# Patient Record
Sex: Male | Born: 1965 | Race: White | Hispanic: No | Smoking: Never smoker
Health system: Southern US, Community
[De-identification: ages and names within clinical notes are randomized; demographics above are authoritative.]

## PROBLEM LIST (undated history)

## (undated) DIAGNOSIS — I639 Cerebral infarction, unspecified: Secondary | ICD-10-CM

## (undated) DIAGNOSIS — F329 Major depressive disorder, single episode, unspecified: Secondary | ICD-10-CM

## (undated) DIAGNOSIS — F32A Depression, unspecified: Secondary | ICD-10-CM

## (undated) DIAGNOSIS — G4730 Sleep apnea, unspecified: Secondary | ICD-10-CM

## (undated) DIAGNOSIS — E119 Type 2 diabetes mellitus without complications: Secondary | ICD-10-CM

## (undated) DIAGNOSIS — G459 Transient cerebral ischemic attack, unspecified: Secondary | ICD-10-CM

## (undated) DIAGNOSIS — M5430 Sciatica, unspecified side: Secondary | ICD-10-CM

## (undated) DIAGNOSIS — I10 Essential (primary) hypertension: Secondary | ICD-10-CM

## (undated) HISTORY — PX: BACK SURGERY: SHX140

## (undated) HISTORY — DX: Cerebral infarction, unspecified: I63.9

## (undated) HISTORY — DX: Sciatica, unspecified side: M54.30

## (undated) HISTORY — PX: BILATERAL KNEE ARTHROSCOPY: SUR91

## (undated) HISTORY — PX: CHOLECYSTECTOMY: SHX55

---

## 2008-12-18 ENCOUNTER — Ambulatory Visit: Payer: Self-pay | Admitting: Specialist

## 2008-12-24 ENCOUNTER — Ambulatory Visit: Payer: Self-pay | Admitting: Specialist

## 2012-01-11 ENCOUNTER — Observation Stay: Payer: Self-pay | Admitting: Internal Medicine

## 2012-01-11 LAB — TROPONIN I
Troponin-I: <0.02 ng/mL
Troponin-I: <0.02 ng/mL

## 2012-01-11 LAB — CK TOTAL AND CKMB (NOT AT ARMC)
CK, Total: 58 U/L (ref 35–232)
CK-MB: 0.5 ng/mL (ref 0.5–3.6)
CK-MB: <0.5 ng/mL — ABNORMAL LOW (ref 0.5–3.6)

## 2012-01-11 LAB — URINALYSIS, COMPLETE
Bacteria: NONE SEEN
Blood: NEGATIVE
Glucose,UR: >=500 mg/dL (ref 0–75)
Leukocyte Esterase: NEGATIVE
Nitrite: NEGATIVE
Ph: 6 (ref 4.5–8.0)
Protein: NEGATIVE
RBC,UR: <1 /HPF (ref 0–5)
Specific Gravity: 1.039 (ref 1.003–1.030)
WBC UR: <1 /HPF (ref 0–5)

## 2012-01-11 LAB — MAGNESIUM: Magnesium: 1.7 mg/dL — ABNORMAL LOW

## 2012-01-11 LAB — COMPREHENSIVE METABOLIC PANEL
Anion Gap: 7 (ref 7–16)
BUN: 16 mg/dL (ref 7–18)
Bilirubin,Total: 1.9 mg/dL — ABNORMAL HIGH (ref 0.2–1.0)
Chloride: 97 mmol/L — ABNORMAL LOW (ref 98–107)
Co2: 28 mmol/L (ref 21–32)
EGFR (African American): >60
EGFR (Non-African Amer.): >60
Osmolality: 280 (ref 275–301)
Potassium: 3.8 mmol/L (ref 3.5–5.1)
SGOT(AST): 18 U/L (ref 15–37)
SGPT (ALT): 39 U/L (ref 12–78)
Total Protein: 7.8 g/dL (ref 6.4–8.2)

## 2012-01-11 LAB — CBC
HCT: 46.7 % (ref 40.0–52.0)
MCV: 90 fL (ref 80–100)
RBC: 5.18 10*6/uL (ref 4.40–5.90)

## 2012-01-11 LAB — LIPID PANEL: Triglycerides: 197 mg/dL (ref 0–200)

## 2014-08-26 NOTE — H&P (Signed)
PATIENT NAME:  Brian Daniel, Brian Daniel MR#:  [id number] DATE OF BIRTH:  [birth year]  DATE OF ADMISSION:  01/11/2012  PRIMARY CARE PHYSICIAN:  Dr. Dennison Mascot  ED REFERRING PHYSICIAN:    Dr. Gaetano Net    CHIEF COMPLAINT: Left-sided numbness and impaired speech.   HISTORY OF PRESENT ILLNESS: 49 year old Caucasian male with past medical history notable for prediabetes and hypertension presents with one day's duration of paresthesias and slurred speech. The patient was in his usual state of health until one day prior to admission when he developed nausea at work. It was subsequently noted that he was hypertensive with blood pressure of 150/100, which is unusually high for him. He also notes that he had some slurred speech and his wife noticed this as well. However, he just stayed home and waited for these symptoms to pass. There was some associated dizziness as well.  He denies any headaches. At about 3:00 a.m. on the day of presentation the patient woke up from sleep to micturate and at that time noticed that he had left-sided numbness and tingling, felt like "my arm was asleep". The sensation was intermittent. It would last for seconds and would recur within a few minutes and due to recurrence the patient decided to present in the ER.  Please note that the paresthesias developed first in his arm and then proceeded to his legs. At the time of my examination he notes that the arm tingling has resolved, but he still has intermittent and transient left lower extremity paresthesias. He notes that this morning when he noticed his left arm numbness and tingling he also had mild left retrosternal chest pain that felt like a tightness and soreness that lasted about five minutes and faded away with rest.  " I felt my heart beating."   On evaluation in the Emergency Department the patient was noted to have elevated blood glucose of 353 and was normotensive with systolic blood pressure 133/93.   PAST MEDICAL HISTORY:   1. Borderline diabetes.  2. Hypertension.   3. Possible benign prostatic hypertrophy. He notes he had symptoms of hesitancy. He was placed on Flomax and his symptoms have improved.  4. Denies history of hyperlipidemia.  5. History of tendinitis in the right elbow.  6. Internal hemorrhoids.  PAST SURGICAL HISTORY:  1. Low back surgery.  2. Bilateral knee arthroscopies.  3. Right elbow arthroscopy due to tendinitis.  4. Cholecystectomy.   ALLERGIES: No known drug allergies.   MEDICATIONS:  1. Hydrochlorothiazide/lisinopril 12.5 mg/20 mg daily.  2. Flomax 0.4 mg daily.  3. Aspirin 325 mg daily.   FAMILY HISTORY: Both parents are healthy. The patient denies any medical problems. Father had epilepsy; however, he said this is due to trauma and was not hereditary.  He has one sister that is healthy. Maternal grandfather had coronary artery disease, paternal grandfather also had coronary artery disease. His maternal grandmother expired from complications of surgery.   SOCIAL HISTORY: He works as a Retail banker for [employer]. He has never smoked. He drinks two glasses of alcohol monthly. Denies illicit drug use. He has been married for three months.   REVIEW OF SYSTEMS: CONSTITUTIONAL: No fever. Admits to fatigue. No weight loss. No weight gain.  EYES: Denies blurred or double vision. No pain. ENT: Intermittent tinnitus. He has been told he snores. RESPIRATORY: Denies cough or wheeze. Admits to some shortness of breath, feeling like he just had to catch his breath over the last 24 hours. CARDIOVASCULAR: Positive chest pain  as noted in the history of present illness. No orthopnea. No edema. Mild dyspnea on exertion. He reports that after one flight of steps he feels worn out. This has been going on over the last 24 hours as well. GI: Admitted to nausea. Denies vomiting, diarrhea.  He had rectal bleeding a few weeks ago deemed internal hemorrhoids. Denies melena. GU: Denies  dysuria or frequency. Admits to polyuria, but this is chronic since he has been on his current medications. Denies polydipsia. ENDOCRINE: Denies polyuria. Admits to nocturia average 2 times per night and this is chronic. Denies increased sweating. Admits to cold sweats during his episode a day prior to admission. INTEGUMENT: Denies new rash or acne. MUSCULOSKELETAL: Denies pain in joints or swelling. Admits to intermittent cramps.  NEURO: Admits to numbness as mentioned  in the history of present illness as well as dysarthria. Denies weakness or headaches. Admits to dizziness with position changes, particularly when he goes from sitting to standing.  PSYCH: Denies anxiety, insomnia, or depression.   PHYSICAL EXAMINATION:  VITAL SIGNS: Temperature 97.9, heart rate 77, respirations 18, blood pressure 133/93, sating 97% on room air.  He weighs 210 pounds (95.2 kg).   GENERAL: Well-appearing Caucasian male lying comfortably on a stretcher.   EYES: Anicteric. Pink conjunctivae. Pupils equally round and reactive to light and accommodation.   ENT: Normal external ears and nares. Posterior oropharyngeal crowding is noted. There are no oral lesions. Moist mucous membranes.   NECK: Supple. There is no thyromegaly.   CARDIOVASCULAR: Normal S1, S2, regular rate and rhythm. No murmurs, rubs, or gallops. No pretibial edema. Normal distal pulses.   PULMONARY: Clear to auscultation bilaterally. No wheezes, rales, or rhonchi. Normal effort.   ABDOMEN: Soft, nontender, nondistended with normal bowel sounds. No hepatosplenomegaly is appreciated.   SKIN: Warm and dry. There are freckles as well as small benign nevi on his trunk. No areas of erythema or induration are appreciated. There is normal color.   PSYCHIATRIC: Awake, alert, oriented times three with normal insight and judgment.   LABORATORY DATA: Beta natriuretic peptide is less than 5, CK total is 73, CK-MB 0.5, glucose 353 on the basic metabolic panel.  BUN 16, creatinine 1.3. Sodium 132, potassium is 3.8, chloride 97, bicarbonate 28, calcium 9.4, bilirubin is 1.9, alkaline phosphatase 77, ALT 39, AST 18, total protein 7.8, albumin 4.1, osmolality 280, anion gap 7, WBC count 6.3, hemoglobin 16.6, hematocrit 46.7, and platelet count of 174, MCV of 90, magnesium is 1.7, which is slightly low. Troponin is less than 0.02. CT head without contrast shows no evidence of mass effect, midline shift or extra-axial fluid collection. There is no evidence of space-occupying lesion or intracranial hemorrhage. There is a small wedge-shaped hypodensity in the left cerebellum concerning for an age-indeterminate infarct. Point of care Accu-Chek is 344.   EKG shows normal sinus rhythm at 80 beats per minute with poor R-wave progression with probable voltage criteria for left ventricular hypertrophy.  ASSESSMENT AND PLAN: 49 year old gentleman presenting with dysarthria, paresthesias, hypertension, and chest pain, found to be hyperglycemic, evidence of cerebellar infarct concerning for acute cerebrovascular accident.  1. Cerebrovascular accident/transient ischemic attack: Evidence of hypodensity in the left cerebellum. We will rule out acute cerebrovascular accident by checking MRI of the brain. We will check carotid ultrasounds. We will check an echocardiogram. We will risk stratify the patient given his hyperglycemia. We will check an A1c to rule out frank diabetes. We will control his blood pressure. We will start him  on a statin empirically and aspirin daily and check a fasting lipid panel in the morning.  Speech evaluation has been performed in the Emergency Department and the patient passed. Will check TSH. 2. Chest pain: This might have been as a result of hyperglycemia. However, given associated shortness of breath we will rule out cardiac disease by trending cardiac enzymes and would recommend possible outpatient stress test for further evaluation if cardiac enzymes are  negative. 3.  Hyperglycemia: Pre diabetes reportedly in the past. Chek hemoglobin A1C for futher evaluation. Normal saline boluses for 2 liters then will continue normal saline at 13925ml/hr. Start sliding scale insulin coverage and outpatient diabetic regimen will be determined when more data is available.  4.  Hypomagnesemia: Repleted 5. Hypertension: Well controlled at this point. We will maintain him on hydrochlorothiazide and lisinopril and have p.r.n. medications for elevated blood pressures.  6. Prostatic hypertrophy: Continue Flomax.  7. Prophylaxis: Lovenox. 8. CODE STATUS: FULL CODE.   DISPOSITION:  The patient is being admitted for further monitoring and evaluation. Anticipate discharge in about 48-72 hours.   TIME SPENT: 60 minutes.   ____________________________ Aurther LoftAdaorah E. Brenan [place], DO aeo:bjt D: 01/11/2012 08:34:14 ET T: 01/11/2012 09:29:17 ET JOB#: [id number]  cc: Aurther LoftAdaorah E. Iden Stripling, DO, <Dictator> Dennison MascotLemont Morrisey, MD Birney Belshe E Venkat Ankney DO ELECTRONICALLY SIGNED 01/12/2012 5:48

## 2014-08-26 NOTE — Discharge Summary (Signed)
PATIENT NAME:  Brian Daniel, Brian Daniel MR#:  [id number] DATE OF BIRTH:  Sep 21, 1965  DATE OF ADMISSION:  01/11/2012 DATE OF DISCHARGE:  01/11/2012  DISCHARGE DIAGNOSES:  1. 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  2. Hypomagnesemia, repleted.  3. Uncontrolled diabetes with hemoglobin A1c greater than 10, noncompliant with medications. Was counseled for a long time and given diabetic education. Recommended to follow-up with diabetic clinic as an outpatient and with endocrine physician. Started on glyburide.   SECONDARY DIAGNOSES:  1. Diabetes.  2. Hypertension.  3. Benign prostatic hypertrophy.  4. Hyperlipidemia.  5. History of internal hemorrhoids.   CONSULTATIONS: None.   PROCEDURES/RADIOLOGY:  1. CT scan of the head without contrast on September 4th showed small age indeterminate left cerebellar infarct.   2. MRI of the brain without contrast on September 4th showed findings consistent with chronic regional small peripheral lacunar infarction along the posterolateral aspect of the left cerebellar hemisphere. No evidence of acute abnormality.  3. MRA of the brain without contrast on September 4th showed unremarkable circle of Willis.  4. Carotid Doppler's on September 4th showed no evidence of hemodynamically significant stenosis.  5. 2-D echocardiogram on September 4th showed normal LV systolic function. EF of 55%. Mild mitral regurgitation. No apparent source of CVA.   HISTORY AND SHORT HOSPITAL COURSE: The patient is a 49 year old male with above-mentioned medical problems who was admitted for possible TIA versus CVA. He was found to have uncontrolled diabetes with hemoglobin A1c of 10.8. The patient did admit to not taking his diabetes medication in the form of metformin and was not checking his sugars at home  also. He had negative two sets of cardiac enzymes and had a normal TSH. He did have low magnesium which was replaced. He underwent complete neurological evaluation which showed no acute findings and he was discharged home in stable condition as his symptoms had resolved.   On the date of discharge, his vital signs were as follows: Temperature 98, heart rate 86 per minute, respirations 18 per minute, blood pressure 119/80 mmHg. He was saturating 94% on room air.   PERTINENT PHYSICAL EXAMINATION ON THE DATE OF DISCHARGE: CARDIOVASCULAR: S1, S2 normal. No murmur, rubs, or gallop. LUNGS: Clear to auscultation bilaterally. No wheezing, rales, rhonchi, or crepitation. ABDOMEN: Soft, benign. NEUROLOGIC: Nonfocal examination. All other physical examination remained at baseline.   DISCHARGE MEDICATIONS:  1. Hydrochlorothiazide/lisinopril 12.5/20 one tablet p.o. daily.   2. Flomax 0.4 mg p.o. daily.  3. Lipitor 10 mg p.o. at bedtime.  4. Glipizide 10 mg p.o. daily.   DISCHARGE DIET: Low sodium, low fat, low cholesterol, 1800 ADA.   DISCHARGE ACTIVITY: As tolerated.   DISCHARGE INSTRUCTIONS AND FOLLOW-UP:  1. The patient was instructed to follow-up with his primary care physician, Dr. Dennison MascotLemont Morrisey, in 1 to 2 weeks.  2. He will need follow-up with [facility] in 2 to 4 weeks.  3. He was also instructed to follow-up with [facility] in 1 to 2 weeks for diabetic education.          TOTAL TIME DISCHARGING THIS PATIENT: 55 minutes.   ____________________________ Ellamae SiaVipul S. Sherryll BurgerShah, MD vss:drc D: 01/11/2012 22:56:19 ET T: 01/13/2012 11:29:29 ET JOB#: [id number]  cc: Arman Loy S. Sherryll BurgerShah, MD, <Dictator> Dennison MascotLemont Morrisey, MD A. Wendall MolaMelissa Solum, MD [facility]  Ellamae Sia [facility] MD ELECTRONICALLY SIGNED 01/13/2012 23:23

## 2016-05-04 ENCOUNTER — Encounter: Payer: Self-pay | Admitting: Emergency Medicine

## 2016-05-04 ENCOUNTER — Observation Stay
Admission: EM | Admit: 2016-05-04 | Discharge: 2016-05-05 | Disposition: A | Discharge status: Home / Self Care | Payer: BLUE CROSS/BLUE SHIELD | Attending: Internal Medicine | Admitting: Internal Medicine

## 2016-05-04 ENCOUNTER — Emergency Department: Payer: BLUE CROSS/BLUE SHIELD

## 2016-05-04 DIAGNOSIS — E119 Type 2 diabetes mellitus without complications: Secondary | ICD-10-CM | POA: Diagnosis not present

## 2016-05-04 DIAGNOSIS — I10 Essential (primary) hypertension: Secondary | ICD-10-CM | POA: Diagnosis not present

## 2016-05-04 DIAGNOSIS — T402X5A Adverse effect of other opioids, initial encounter: Secondary | ICD-10-CM | POA: Insufficient documentation

## 2016-05-04 DIAGNOSIS — R0789 Other chest pain: Secondary | ICD-10-CM | POA: Diagnosis present

## 2016-05-04 DIAGNOSIS — Z79899 Other long term (current) drug therapy: Secondary | ICD-10-CM | POA: Diagnosis not present

## 2016-05-04 DIAGNOSIS — T450X5A Adverse effect of antiallergic and antiemetic drugs, initial encounter: Secondary | ICD-10-CM | POA: Diagnosis not present

## 2016-05-04 DIAGNOSIS — R55 Syncope and collapse: Secondary | ICD-10-CM

## 2016-05-04 DIAGNOSIS — Z8673 Personal history of transient ischemic attack (TIA), and cerebral infarction without residual deficits: Secondary | ICD-10-CM | POA: Diagnosis not present

## 2016-05-04 DIAGNOSIS — R001 Bradycardia, unspecified: Secondary | ICD-10-CM | POA: Diagnosis not present

## 2016-05-04 DIAGNOSIS — R079 Chest pain, unspecified: Secondary | ICD-10-CM | POA: Diagnosis not present

## 2016-05-04 DIAGNOSIS — Z7984 Long term (current) use of oral hypoglycemic drugs: Secondary | ICD-10-CM | POA: Diagnosis not present

## 2016-05-04 HISTORY — DX: Transient cerebral ischemic attack, unspecified: G45.9

## 2016-05-04 HISTORY — DX: Type 2 diabetes mellitus without complications: E11.9

## 2016-05-04 HISTORY — DX: Essential (primary) hypertension: I10

## 2016-05-04 LAB — TROPONIN I

## 2016-05-04 LAB — BASIC METABOLIC PANEL
Anion gap: 9 (ref 5–15)
BUN: 12 mg/dL (ref 6–20)
CALCIUM: 9.3 mg/dL (ref 8.9–10.3)
CO2: 24 mmol/L (ref 22–32)
CREATININE: 1.04 mg/dL (ref 0.61–1.24)
Chloride: 100 mmol/L — ABNORMAL LOW (ref 101–111)
GFR calc non Af Amer: >60 mL/min (ref >60)
Glucose, Bld: 350 mg/dL — ABNORMAL HIGH (ref 65–99)
Potassium: 3.1 mmol/L — ABNORMAL LOW (ref 3.5–5.1)
SODIUM: 133 mmol/L — AB (ref 135–145)

## 2016-05-04 LAB — CBC
HCT: 46.1 % (ref 40.0–52.0)
Hemoglobin: 16 g/dL (ref 13.0–18.0)
MCH: 30.5 pg (ref 26.0–34.0)
MCHC: 34.8 g/dL (ref 32.0–36.0)
MCV: 87.6 fL (ref 80.0–100.0)
PLATELETS: 196 10*3/uL (ref 150–440)
RBC: 5.26 MIL/uL (ref 4.40–5.90)
RDW: 12.8 % (ref 11.5–14.5)
WBC: 6.6 10*3/uL (ref 3.8–10.6)

## 2016-05-04 NOTE — ED Triage Notes (Signed)
Patient states that he developed chest pain this afternoon that has become worse throughout the day. Patient states that it is hard to take a deep breath.

## 2016-05-05 ENCOUNTER — Emergency Department: Payer: BLUE CROSS/BLUE SHIELD

## 2016-05-05 ENCOUNTER — Encounter: Payer: Self-pay | Admitting: Radiology

## 2016-05-05 DIAGNOSIS — R079 Chest pain, unspecified: Secondary | ICD-10-CM | POA: Diagnosis present

## 2016-05-05 DIAGNOSIS — R0789 Other chest pain: Secondary | ICD-10-CM | POA: Diagnosis present

## 2016-05-05 LAB — GLUCOSE, CAPILLARY
GLUCOSE-CAPILLARY: 284 mg/dL — AB (ref 65–99)
Glucose-Capillary: 256 mg/dL — ABNORMAL HIGH (ref 65–99)
Glucose-Capillary: 278 mg/dL — ABNORMAL HIGH (ref 65–99)

## 2016-05-05 LAB — FIBRIN DERIVATIVES D-DIMER (ARMC ONLY): FIBRIN DERIVATIVES D-DIMER (ARMC): 203 (ref 0–499)

## 2016-05-05 LAB — MAGNESIUM: Magnesium: 2 mg/dL (ref 1.7–2.4)

## 2016-05-05 LAB — TROPONIN I: Troponin I: <0.03 ng/mL (ref <0.03)

## 2016-05-05 LAB — TSH: TSH: 2.596 u[IU]/mL (ref 0.350–4.500)

## 2016-05-05 MED ORDER — DOCUSATE SODIUM 100 MG PO CAPS: 100.0000 mg | ORAL_CAPSULE | Freq: Two times a day (BID) | ORAL | Status: DC

## 2016-05-05 MED ORDER — ONDANSETRON HCL 4 MG/2ML IJ SOLN: 4.0000 mg | Freq: Four times a day (QID) | INTRAMUSCULAR | Status: DC | PRN

## 2016-05-05 MED ORDER — KETOROLAC TROMETHAMINE 30 MG/ML IJ SOLN
30.0000 mg | Freq: Once | INTRAMUSCULAR | Status: AC
  Administered 2016-05-05: 30 mg via INTRAVENOUS
  Filled 2016-05-05: qty 1

## 2016-05-05 MED ORDER — HYDROCHLOROTHIAZIDE 12.5 MG PO CAPS
ORAL_CAPSULE | ORAL | Status: AC
  Administered 2016-05-05: 12.5 mg via ORAL
  Filled 2016-05-05: qty 1

## 2016-05-05 MED ORDER — SODIUM CHLORIDE 0.9 % IV SOLN
INTRAVENOUS | Status: DC
  Administered 2016-05-05: 07:00:00 via INTRAVENOUS

## 2016-05-05 MED ORDER — ONDANSETRON HCL 4 MG/2ML IJ SOLN
4.0000 mg | Freq: Once | INTRAMUSCULAR | Status: AC
  Administered 2016-05-05: 4 mg via INTRAVENOUS

## 2016-05-05 MED ORDER — ADULT MULTIVITAMIN W/MINERALS CH
1.0000 | ORAL_TABLET | Freq: Every day | ORAL | Status: DC
  Administered 2016-05-05: 1 via ORAL
  Filled 2016-05-05: qty 1

## 2016-05-05 MED ORDER — INSULIN ASPART 100 UNIT/ML ~~LOC~~ SOLN
0.0000 [IU] | Freq: Three times a day (TID) | SUBCUTANEOUS | Status: DC
  Administered 2016-05-05: 5 [IU] via SUBCUTANEOUS
  Filled 2016-05-05: qty 5

## 2016-05-05 MED ORDER — ONDANSETRON HCL 4 MG/2ML IJ SOLN
INTRAMUSCULAR | Status: AC
  Administered 2016-05-05: 4 mg via INTRAVENOUS
  Filled 2016-05-05: qty 2

## 2016-05-05 MED ORDER — IOPAMIDOL (ISOVUE-370) INJECTION 76%
100.0000 mL | Freq: Once | INTRAVENOUS | Status: AC | PRN
  Administered 2016-05-05: 125 mL via INTRAVENOUS

## 2016-05-05 MED ORDER — ACETAMINOPHEN 650 MG RE SUPP: 650.0000 mg | Freq: Four times a day (QID) | RECTAL | Status: DC | PRN

## 2016-05-05 MED ORDER — LISINOPRIL 10 MG PO TABS
10.0000 mg | ORAL_TABLET | Freq: Every day | ORAL | Status: DC
  Administered 2016-05-05: 10 mg via ORAL
  Filled 2016-05-05: qty 1

## 2016-05-05 MED ORDER — INSULIN ASPART 100 UNIT/ML ~~LOC~~ SOLN: 0.0000 [IU] | Freq: Every day | SUBCUTANEOUS | Status: DC

## 2016-05-05 MED ORDER — INFLUENZA VAC SPLIT QUAD 0.5 ML IM SUSY: 0.5000 mL | PREFILLED_SYRINGE | INTRAMUSCULAR | Status: DC

## 2016-05-05 MED ORDER — LISINOPRIL 10 MG PO TABS
ORAL_TABLET | ORAL | Status: AC
  Administered 2016-05-05: 10 mg via ORAL
  Filled 2016-05-05: qty 1

## 2016-05-05 MED ORDER — ASPIRIN EC 81 MG PO TBEC: 81.0000 mg | DELAYED_RELEASE_TABLET | Freq: Every day | ORAL | 0 refills | Status: DC

## 2016-05-05 MED ORDER — SODIUM CHLORIDE 0.9% FLUSH: 3.0000 mL | Freq: Two times a day (BID) | INTRAVENOUS | Status: DC

## 2016-05-05 MED ORDER — ONDANSETRON HCL 4 MG PO TABS: 4.0000 mg | ORAL_TABLET | Freq: Four times a day (QID) | ORAL | Status: DC | PRN

## 2016-05-05 MED ORDER — ASPIRIN 81 MG PO CHEW
CHEWABLE_TABLET | ORAL | Status: AC
  Administered 2016-05-05: 324 mg via ORAL
  Filled 2016-05-05: qty 4

## 2016-05-05 MED ORDER — MORPHINE SULFATE (PF) 4 MG/ML IV SOLN
INTRAVENOUS | Status: AC
  Administered 2016-05-05: 4 mg via INTRAVENOUS
  Filled 2016-05-05: qty 1

## 2016-05-05 MED ORDER — ASPIRIN 81 MG PO CHEW
324.0000 mg | CHEWABLE_TABLET | Freq: Once | ORAL | Status: AC
  Administered 2016-05-05: 324 mg via ORAL

## 2016-05-05 MED ORDER — HYDROCHLOROTHIAZIDE 12.5 MG PO CAPS
12.5000 mg | ORAL_CAPSULE | Freq: Every day | ORAL | Status: DC
  Administered 2016-05-05: 12.5 mg via ORAL
  Filled 2016-05-05: qty 1

## 2016-05-05 MED ORDER — MORPHINE SULFATE (PF) 4 MG/ML IV SOLN
4.0000 mg | Freq: Once | INTRAVENOUS | Status: AC
  Administered 2016-05-05: 4 mg via INTRAVENOUS

## 2016-05-05 MED ORDER — LISINOPRIL-HYDROCHLOROTHIAZIDE 10-12.5 MG PO TABS: 1.0000 | ORAL_TABLET | Freq: Every day | ORAL | Status: DC

## 2016-05-05 MED ORDER — ACETAMINOPHEN 325 MG PO TABS: 650.0000 mg | ORAL_TABLET | Freq: Four times a day (QID) | ORAL | Status: DC | PRN

## 2016-05-05 MED ORDER — POTASSIUM CHLORIDE CRYS ER 20 MEQ PO TBCR
40.0000 meq | EXTENDED_RELEASE_TABLET | ORAL | Status: AC
  Administered 2016-05-05: 40 meq via ORAL
  Filled 2016-05-05: qty 2

## 2016-05-05 MED ORDER — ENOXAPARIN SODIUM 40 MG/0.4ML ~~LOC~~ SOLN: 40.0000 mg | SUBCUTANEOUS | Status: DC

## 2016-05-05 NOTE — Discharge Summary (Addendum)
[facility] [place] [facility]   PATIENT NAME: Brian AcostaMichael Daniel    MR#:  [id number]  DATE OF BIRTH:  May 12, 1965  DATE OF ADMISSION:  05/04/2016 ADMITTING PHYSICIAN: Brian NatalMichael S Diamond, MD  DATE OF DISCHARGE: 05/05/16  PRIMARY CARE PHYSICIAN: Brian Daniel, Jaiceon, PA-C    ADMISSION DIAGNOSIS:  Vasovagal near syncope [R55] Chest pain, unspecified type [R07.9]  DISCHARGE DIAGNOSIS:  Chest pain Bradycardia-transient  Now resolves HTN  SECONDARY DIAGNOSIS:   Past Medical History:  Diagnosis Date  . Diabetes mellitus without complication (HCC)   . Hypertension   . TIA (transient ischemic attack)     HOSPITAL COURSE:  50 year old male admitted for atypical chest pain.  1. Chest pain: Atypical  - Associated with an isolated episode of symptomatic bradycardia. The patient admits to a similar incident nearly 20 years ago in which he passed out in his living room. - CTA of the chest negative for pulmonary embolism and dissection. -seen by cardiology. Recommends f/u tomorrow for stress test./ pt aware of the appt  2. Hypertension: Reasonably controlled at this time; continue lisinopril with hydrochlorothiazide.  3. Diabetes mellitus type 2 -resume home meds  4. DVT prophylaxis: Lovenox  ambulated well s/o cp---d/c home  CONSULTS OBTAINED:  Treatment Team:  Laurier NancyShaukat A Khan, MD  DRUG ALLERGIES:  No Known Allergies  DISCHARGE MEDICATIONS:   Current Discharge Medication List    START taking these medications   Details  aspirin EC 81 MG tablet Take 1 tablet (81 mg total) by mouth daily. Qty: 30 tablet, Refills: 0      CONTINUE these medications which have NOT CHANGED   Details  lisinopril-hydrochlorothiazide (PRINZIDE,ZESTORETIC) 10-12.5 MG tablet Take 1 tablet by mouth daily.    metFORMIN (GLUCOPHAGE) 500 MG tablet Take 500 mg by mouth 2 (two) times daily with a meal.    Multiple Vitamins-Minerals (MULTIVITAMIN WITH MINERALS) tablet Take 1  tablet by mouth daily.    tadalafil (CIALIS) 5 MG tablet Take 5 mg by mouth daily as needed for erectile dysfunction.        If you experience worsening of your admission symptoms, develop shortness of breath, life threatening emergency, suicidal or homicidal thoughts you must seek medical attention immediately by calling 911 or calling your MD immediately  if symptoms less severe.  You Must read complete instructions/literature along with all the possible adverse reactions/side effects for all the Medicines you take and that have been prescribed to you. Take any new Medicines after you have completely understood and accept all the possible adverse reactions/side effects.   Please note  You were cared for by a hospitalist during your hospital stay. If you have any questions about your discharge medications or the care you received while you were in the hospital after you are discharged, you can call the unit and asked to speak with the hospitalist on call if the hospitalist that took care of you is not available. Once you are discharged, your primary care physician will handle any further medical issues. Please note that NO REFILLS for any discharge medications will be authorized once you are discharged, as it is imperative that you return to your primary care physician (or establish a relationship with a primary care physician if you do not have one) for your aftercare needs so that they can reassess your need for medications and monitor your lab values. Today   SUBJECTIVE  Doing well   VITAL SIGNS:  Blood pressure (!) 151/99, pulse 87, temperature 97.8  F (36.6 C), temperature source Oral, resp. rate 18, height 5\' 9"  (1.753 m), weight 93.6 kg (206 lb 6.4 oz), SpO2 94 %.  I/O:    Intake/Output Summary (Last 24 hours) at 05/05/16 1108 Last data filed at 05/05/16 1026  Gross per 24 hour  Intake            787.5 ml  Output                0 ml  Net            787.5 ml    PHYSICAL  EXAMINATION:  GENERAL:  50 y.o.-year-old patient lying in the bed with no acute distress.  EYES: Pupils equal, round, reactive to light and accommodation. No scleral icterus. Extraocular muscles intact.  HEENT: Head atraumatic, normocephalic. Oropharynx and nasopharynx clear.  NECK:  Supple, no jugular venous distention. No thyroid enlargement, no tenderness.  LUNGS: Normal breath sounds bilaterally, no wheezing, rales,rhonchi or crepitation. No use of accessory muscles of respiration.  CARDIOVASCULAR: S1, S2 normal. No murmurs, rubs, or gallops.  ABDOMEN: Soft, non-tender, non-distended. Bowel sounds present. No organomegaly or mass.  EXTREMITIES: No pedal edema, cyanosis, or clubbing.  NEUROLOGIC: Cranial nerves II through XII are intact. Muscle strength 5/5 in all extremities. Sensation intact. Gait not checked.  PSYCHIATRIC: The patient is alert and oriented x 3.  SKIN: No obvious rash, lesion, or ulcer.   DATA REVIEW:   CBC   Recent Labs Lab 05/04/16 2110  WBC 6.6  HGB 16.0  HCT 46.1  PLT 196    Chemistries   Recent Labs Lab 05/04/16 2110 05/05/16 0040  NA 133*  --   K 3.1*  --   CL 100*  --   CO2 24  --   GLUCOSE 350*  --   BUN 12  --   CREATININE 1.04  --   CALCIUM 9.3  --   MG  --  2.0    Microbiology Results   No results found for this or any previous visit (from the past 240 hour(s)).  RADIOLOGY:  Dg Chest 2 View  Result Date: 05/04/2016 CLINICAL DATA:  Worsening chest pain beginning this afternoon. History of diabetes and hypertension. EXAM: CHEST  2 VIEW COMPARISON:  None. FINDINGS: Cardiomediastinal silhouette is unremarkable for this low inspiratory examination with crowded vasculature markings. The lungs are clear without pleural effusions or focal consolidations. Mildly elevated RIGHT hemidiaphragm. Trachea projects midline and there is no pneumothorax. Included soft tissue planes and osseous structures are non-suspicious. Surgical clips in the  included right abdomen compatible with cholecystectomy. IMPRESSION: No acute cardiopulmonary process. Electronically Signed   By: Awilda Metro M.D.   On: 05/04/2016 21:57   Ct Angio Chest/abd/pel For Dissection W And/or Wo Contrast  Result Date: 05/05/2016 CLINICAL DATA:  Progressive chest pain. EXAM: CT ANGIOGRAPHY CHEST, ABDOMEN AND PELVIS TECHNIQUE: Multidetector CT imaging through the chest, abdomen and pelvis was performed using the standard protocol during bolus administration of intravenous contrast. Multiplanar reconstructed images and MIPs were obtained and reviewed to evaluate the vascular anatomy. CONTRAST:  125 cc Isovue 370 IV COMPARISON:  Chest radiograph yesterday. FINDINGS: CTA CHEST FINDINGS Cardiovascular: Normal caliber thoracic aorta without dissection, hematoma, aneurysm or acute aortic abnormality. Conventional branching pattern from the aortic arch. There are no filling defects in the central pulmonary arteries to the proximal segmental level to suggest pulmonary embolus. The heart is normal in size. No pericardial effusion. Mediastinum/Nodes: No mediastinal, hilar, or axillary adenopathy. Visualized thyroid  gland is normal. The esophagus is decompressed. Lungs/Pleura: Scattered linear and dependent atelectasis throughout both lungs. No confluent airspace disease to suggest pneumonia. No pulmonary edema. No pulmonary mass or suspicious nodule. No pleural fluid. Musculoskeletal: There are no acute or suspicious osseous abnormalities. Review of the MIP images confirms the above findings. CTA ABDOMEN AND PELVIS FINDINGS VASCULAR Aorta: Normal caliber aorta without aneurysm, dissection, vasculitis or significant stenosis. Celiac: Patent without evidence of aneurysm, dissection, vasculitis or significant stenosis. SMA: Patent without evidence of aneurysm, dissection, vasculitis or significant stenosis. Renals: Patent without significant stenosis, aneurysm or vasculitis. Accessory lower  pole right renal artery arises from the distal aorta. IMA: Patent without evidence of aneurysm, dissection, vasculitis or significant stenosis. Inflow: Patent without evidence of aneurysm, dissection, vasculitis or significant stenosis. Veins: No obvious venous abnormality within the limitations of this arterial phase study. Review of the MIP images confirms the above findings. NON-VASCULAR Hepatobiliary: No focal liver abnormality is seen. Status post cholecystectomy. No biliary dilatation. Pancreas: No ductal dilatation or inflammation. Spleen: Normal in size without focal abnormality. Adrenals/Urinary Tract: Adrenal glands are unremarkable. Kidneys are normal, without evidence renal calculi, focal lesion, or hydronephrosis. Bladder is unremarkable. Stomach/Bowel: Stomach is decompressed. No bowel thickening, nondistention or inflammation. Normal appendix. Lymphatic: No adenopathy. Reproductive: Central prostatic calcifications. Other: No free air, free fluid, or intra-abdominal fluid collection. Tiny fat containing umbilical hernia. Musculoskeletal: There are no acute or suspicious osseous abnormalities. Review of the MIP images confirms the above findings. IMPRESSION: Normal thoracoabdominal aorta. No acute abnormality in the chest, abdomen, or pelvis. Electronically Signed   By: Rubye OaksMelanie  Ehinger M.D.   On: 05/05/2016 03:47     Management plans discussed with the patient, family and they are in agreement.  CODE STATUS:     Code Status Orders        Start     Ordered   05/05/16 0634  Full code  Continuous     05/05/16 0633    Code Status History    Date Active Date Inactive Code Status Order ID Comments User Context   This patient has a current code status but no historical code status.      TOTAL TIME TAKING CARE OF THIS PATIENT: 40 minutes.    Dervin Vore M.D on 05/05/2016 at 11:08 AM  Between 7am to 6pm - Pager - [phone] After 6pm go to [email or web address] - password [email or web address]  [facility]  [facility]  Office  [phone]  CC: Primary care physician; Brian Daniel, [place], PA-C

## 2016-05-05 NOTE — Progress Notes (Signed)
D/C IVF.  Patient ambulated around nursing station.  States he is chest pain free and not having any shortness of breath.  Probable discharge today.

## 2016-05-05 NOTE — ED Notes (Signed)
Pt became bradycardic, hypotensive, pale in color, diaphoretic. Pt placed on non-re-breather, 2nd IV placed, cardiac pads placed, EKG printed, NS bolus x2, MD at bedside. Pt transported to CT with RN x2 and MD.

## 2016-05-05 NOTE — ED Provider Notes (Signed)
[facility] Emergency Department Provider Note    First MD Initiated Contact with Patient 05/04/16 2346     (approximate)  I have reviewed the triage vital signs and the nursing notes.   HISTORY  Chief Complaint Chest Pain   HPI Brian Daniel is a 50 y.o. male with bolus of chronic medical conditions presents to the emergency department with constant central nonradiating chest pain has progressively worsened over the course of the day. Patient states that the pain is worse with deep inspiration. Patient states that he's had this discomfort in the past when his "sternum needed to be pop". However is unable to do so today. Patient denies any lower extremity pain or swelling. Patient denies any cardiac history.  Past Medical History:  Diagnosis Date  . Diabetes mellitus without complication (HCC)   . Hypertension   . TIA (transient ischemic attack)     There are no active problems to display for this patient.   Past Surgical History:  Procedure Laterality Date  . CHOLECYSTECTOMY    . JOINT REPLACEMENT      Prior to Admission medications   Not on File    Allergies Patient has no known allergies.  No family history on file.  Social History Social History  Substance Use Topics  . Smoking status: Never Smoker  . Smokeless tobacco: Never Used  . Alcohol use Yes     Comment: occ    Review of Systems Constitutional: No fever/chills Eyes: No visual changes. ENT: No sore throat. Cardiovascular: Positive for chest pain. Respiratory: Denies shortness of breath. Gastrointestinal: No abdominal pain.  No nausea, no vomiting.  No diarrhea.  No constipation. Genitourinary: Negative for dysuria. Musculoskeletal: Negative for back pain. Skin: Negative for rash. Neurological: Negative for headaches, focal weakness or numbness.  10-point ROS otherwise negative.  ____________________________________________   PHYSICAL EXAM:  VITAL SIGNS: ED  Triage Vitals [05/04/16 2109]  Enc Vitals Group     BP      Pulse      Resp      Temp      Temp src      SpO2      Weight 200 lb (90.7 kg)     Height 5\' 9"  (1.753 m)     Head Circumference      Peak Flow      Pain Score 8     Pain Loc      Pain Edu?      Excl. in GC?     Constitutional: Alert and oriented. Well appearing and in no acute distress. Eyes: Conjunctivae are normal. PERRL. EOMI. Head: Atraumatic. Neck: No stridor.  Cardiovascular: Normal rate, regular rhythm. Good peripheral circulation. Grossly normal heart sounds. Respiratory: Normal respiratory effort.  No retractions. Lungs CTAB. Gastrointestinal: Soft and nontender. No distention.  Musculoskeletal: No lower extremity tenderness nor edema. No gross deformities of extremities. Neurologic:  Normal speech and language. No gross focal neurologic deficits are appreciated.  Skin:  Skin is warm, dry and intact. No rash noted. Psychiatric: Mood and affect are normal. Speech and behavior are normal.  ____________________________________________   LABS (all labs ordered are listed, but only abnormal results are displayed)  Labs Reviewed  BASIC METABOLIC PANEL - Abnormal; Notable for the following:       Result Value   Sodium 133 (*)    Potassium 3.1 (*)    Chloride 100 (*)    Glucose, Bld 350 (*)    All other components  within normal limits  CBC  TROPONIN I  FIBRIN DERIVATIVES D-DIMER ([facility] ONLY)  TROPONIN I   ____________________________________________  EKG  ED ECG REPORT I, Kaufman N BROWN, the attending physician, personally viewed and interpreted this ECG.   Date: 05/05/2016  EKG Time: 9:12 PM  Rate: 79  Rhythm: Normal sinus rhythm  Axis: Normal  Intervals: Normal QTC 474  ST&T Change: None  ____________________________________________  RADIOLOGY I,  N BROWN, personally viewed and evaluated these images (plain radiographs) as part of my medical decision making, as well as reviewing  the written report by the radiologist.  Dg Chest 2 View  Result Date: 05/04/2016 CLINICAL DATA:  Worsening chest pain beginning this afternoon. History of diabetes and hypertension. EXAM: CHEST  2 VIEW COMPARISON:  None. FINDINGS: Cardiomediastinal silhouette is unremarkable for this low inspiratory examination with crowded vasculature markings. The lungs are clear without pleural effusions or focal consolidations. Mildly elevated RIGHT hemidiaphragm. Trachea projects midline and there is no pneumothorax. Included soft tissue planes and osseous structures are non-suspicious. Surgical clips in the included right abdomen compatible with cholecystectomy. IMPRESSION: No acute cardiopulmonary process. Electronically Signed   By: Awilda Metroourtnay  Bloomer M.D.   On: 05/04/2016 21:57     Procedures     INITIAL IMPRESSION / ASSESSMENT AND PLAN / ED COURSE  Pertinent labs & imaging results that were available during my care of the patient were reviewed by me and considered in my medical decision making (see chart for details).  50 year old male presents with anterior central chest pain worse with deep inspiration and movement. EKG revealed no evidence of ischemia or infarction troponin negative 2 d-dimer normal. Suspected chest wall is a source of the patient's pain however patient had an abrupt episode of bradycardia and hypotension became acutely diaphoretic and pale. CT scan of the chest abdomen pelvis performed immediately revealed no gross abnormality   Clinical Course     ____________________________________________  FINAL CLINICAL IMPRESSION(S) / ED DIAGNOSES  Final diagnoses:  Chest pain, unspecified type  Vasovagal near syncope     MEDICATIONS GIVEN DURING THIS VISIT:  Medications  ketorolac (TORADOL) 30 MG/ML injection 30 mg (not administered)  aspirin chewable tablet 324 mg (324 mg Oral Given 05/05/16 0014)     NEW OUTPATIENT MEDICATIONS STARTED DURING THIS VISIT:  New  Prescriptions   No medications on file    Modified Medications   No medications on file    Discontinued Medications   No medications on file     Note:  This document was prepared using Dragon voice recognition software and may include unintentional dictation errors.    Darci Currentandolph N Brown, MD 05/05/16 [phone]

## 2016-05-05 NOTE — Progress Notes (Signed)
Brian Daniel is a 50 y.o. male  [id number]  Primary Cardiologist: Adrian Blackwater Reason for Consultation: Chest pain  HPI: This is a 50 year old white male with a past medical history of diabetes hypertension TIA presented to the hospital with chest pain for 12 hours but cardiac enzymes and EKG were unremarkable.. Patient received morphine 4 mg and Zofran 4 mg and developed bradycardia with heart rate as low as 30. Apparently had reaction to these medications.   Review of Systems: No further chest pain or shortness of breath   Past Medical History:  Diagnosis Date  . Diabetes mellitus without complication (HCC)   . Hypertension   . TIA (transient ischemic attack)     Medications Prior to Admission  Medication Sig Dispense Refill  . lisinopril-hydrochlorothiazide (PRINZIDE,ZESTORETIC) 10-12.5 MG tablet Take 1 tablet by mouth daily.    . metFORMIN (GLUCOPHAGE) 500 MG tablet Take 500 mg by mouth 2 (two) times daily with a meal.    . Multiple Vitamins-Minerals (MULTIVITAMIN WITH MINERALS) tablet Take 1 tablet by mouth daily.    . tadalafil (CIALIS) 5 MG tablet Take 5 mg by mouth daily as needed for erectile dysfunction.       . docusate sodium  100 mg Oral BID  . enoxaparin (LOVENOX) injection  40 mg Subcutaneous Q24H  . lisinopril  10 mg Oral Daily   And  . hydrochlorothiazide  12.5 mg Oral Daily  . [START ON 05/06/2016] Influenza vac split quadrivalent PF  0.5 mL Intramuscular Tomorrow-1000  . insulin aspart  0-5 Units Subcutaneous QHS  . insulin aspart  0-9 Units Subcutaneous TID WC  . multivitamin with minerals  1 tablet Oral Daily  . sodium chloride flush  3 mL Intravenous Q12H    Infusions: . sodium chloride 150 mL/hr at 05/05/16 0647    No Known Allergies  Social History   Social History  . Marital status: Married    Spouse name: N/A  . Number of children: N/A  . Years of education: N/A   Occupational History  . Not on file.   Social History Main Topics   . Smoking status: Never Smoker  . Smokeless tobacco: Never Used  . Alcohol use Yes     Comment: occ  . Drug use: No  . Sexual activity: Not on file   Other Topics Concern  . Not on file   Social History Narrative  . No narrative on file    No family history on file.  PHYSICAL EXAM: Vitals:   05/05/16 0615 05/05/16 0635  BP: 136/85 (!) 151/99  Pulse: 81 87  Resp: 12 18  Temp:  97.8 F (36.6 C)     Intake/Output Summary (Last 24 hours) at 05/05/16 0856 Last data filed at 05/05/16 4098  Gross per 24 hour  Intake                0 ml  Output                0 ml  Net                0 ml    General:  Well appearing. No respiratory difficulty HEENT: normal Neck: supple. no JVD. Carotids 2+ bilat; no bruits. No lymphadenopathy or thryomegaly appreciated. Cor: PMI nondisplaced. Regular rate & rhythm. No rubs, gallops or murmurs. Lungs: clear Abdomen: soft, nontender, nondistended. No hepatosplenomegaly. No bruits or masses. Good bowel sounds. Extremities: no cyanosis, clubbing, rash, edema Neuro: alert & oriented  x 3, cranial nerves grossly intact. moves all 4 extremities w/o difficulty. Affect pleasant.  ECG: Sinus rhythm with nonspecific ST-T changes  Results for orders placed or performed during the hospital encounter of 05/04/16 (from the past 24 hour(s))  Basic metabolic panel     Status: Abnormal   Collection Time: 05/04/16  9:10 PM  Result Value Ref Range   Sodium 133 (L) 135 - 145 mmol/L   Potassium 3.1 (L) 3.5 - 5.1 mmol/L   Chloride 100 (L) 101 - 111 mmol/L   CO2 24 22 - 32 mmol/L   Glucose, Bld 350 (H) 65 - 99 mg/dL   BUN 12 6 - 20 mg/dL   Creatinine, Ser 1.611.04 0.61 - 1.24 mg/dL   Calcium 9.3 8.9 - 09.610.3 mg/dL   GFR calc non Af Amer >60 >60 mL/min   GFR calc Af Amer >60 >60 mL/min   Anion gap 9 5 - 15  CBC     Status: None   Collection Time: 05/04/16  9:10 PM  Result Value Ref Range   WBC 6.6 3.8 - 10.6 K/uL   RBC 5.26 4.40 - 5.90 MIL/uL   Hemoglobin  16.0 13.0 - 18.0 g/dL   HCT 04.546.1 40.940.0 - 81.152.0 %   MCV 87.6 80.0 - 100.0 fL   MCH 30.5 26.0 - 34.0 pg   MCHC 34.8 32.0 - 36.0 g/dL   RDW 91.412.8 78.211.5 - 95.614.5 %   Platelets 196 150 - 440 K/uL  Troponin I     Status: None   Collection Time: 05/04/16  9:10 PM  Result Value Ref Range   Troponin I <0.03 <0.03 ng/mL  Fibrin derivatives D-Dimer     Status: None   Collection Time: 05/05/16 12:40 AM  Result Value Ref Range   Fibrin derivatives D-dimer ([facility]) 203 0 - 499  Troponin I     Status: None   Collection Time: 05/05/16 12:40 AM  Result Value Ref Range   Troponin I <0.03 <0.03 ng/mL  Magnesium     Status: None   Collection Time: 05/05/16 12:40 AM  Result Value Ref Range   Magnesium 2.0 1.7 - 2.4 mg/dL  Glucose, capillary     Status: Abnormal   Collection Time: 05/05/16  1:28 AM  Result Value Ref Range   Glucose-Capillary 284 (H) 65 - 99 mg/dL  TSH     Status: None   Collection Time: 05/05/16  6:39 AM  Result Value Ref Range   TSH 2.596 0.350 - 4.500 uIU/mL  Troponin I     Status: None   Collection Time: 05/05/16  6:39 AM  Result Value Ref Range   Troponin I <0.03 <0.03 ng/mL  Glucose, capillary     Status: Abnormal   Collection Time: 05/05/16  6:43 AM  Result Value Ref Range   Glucose-Capillary 256 (H) 65 - 99 mg/dL  Glucose, capillary     Status: Abnormal   Collection Time: 05/05/16  7:54 AM  Result Value Ref Range   Glucose-Capillary 278 (H) 65 - 99 mg/dL   Dg Chest 2 View  Result Date: 05/04/2016 CLINICAL DATA:  Worsening chest pain beginning this afternoon. History of diabetes and hypertension. EXAM: CHEST  2 VIEW COMPARISON:  None. FINDINGS: Cardiomediastinal silhouette is unremarkable for this low inspiratory examination with crowded vasculature markings. The lungs are clear without pleural effusions or focal consolidations. Mildly elevated RIGHT hemidiaphragm. Trachea projects midline and there is no pneumothorax. Included soft tissue planes and osseous structures are  non-suspicious.  Surgical clips in the included right abdomen compatible with cholecystectomy. IMPRESSION: No acute cardiopulmonary process. Electronically Signed   By: Awilda Metroourtnay  Bloomer M.D.   On: 05/04/2016 21:57   Ct Angio Chest/abd/pel For Dissection W And/or Wo Contrast  Result Date: 05/05/2016 CLINICAL DATA:  Progressive chest pain. EXAM: CT ANGIOGRAPHY CHEST, ABDOMEN AND PELVIS TECHNIQUE: Multidetector CT imaging through the chest, abdomen and pelvis was performed using the standard protocol during bolus administration of intravenous contrast. Multiplanar reconstructed images and MIPs were obtained and reviewed to evaluate the vascular anatomy. CONTRAST:  125 cc Isovue 370 IV COMPARISON:  Chest radiograph yesterday. FINDINGS: CTA CHEST FINDINGS Cardiovascular: Normal caliber thoracic aorta without dissection, hematoma, aneurysm or acute aortic abnormality. Conventional branching pattern from the aortic arch. There are no filling defects in the central pulmonary arteries to the proximal segmental level to suggest pulmonary embolus. The heart is normal in size. No pericardial effusion. Mediastinum/Nodes: No mediastinal, hilar, or axillary adenopathy. Visualized thyroid gland is normal. The esophagus is decompressed. Lungs/Pleura: Scattered linear and dependent atelectasis throughout both lungs. No confluent airspace disease to suggest pneumonia. No pulmonary edema. No pulmonary mass or suspicious nodule. No pleural fluid. Musculoskeletal: There are no acute or suspicious osseous abnormalities. Review of the MIP images confirms the above findings. CTA ABDOMEN AND PELVIS FINDINGS VASCULAR Aorta: Normal caliber aorta without aneurysm, dissection, vasculitis or significant stenosis. Celiac: Patent without evidence of aneurysm, dissection, vasculitis or significant stenosis. SMA: Patent without evidence of aneurysm, dissection, vasculitis or significant stenosis. Renals: Patent without significant stenosis,  aneurysm or vasculitis. Accessory lower pole right renal artery arises from the distal aorta. IMA: Patent without evidence of aneurysm, dissection, vasculitis or significant stenosis. Inflow: Patent without evidence of aneurysm, dissection, vasculitis or significant stenosis. Veins: No obvious venous abnormality within the limitations of this arterial phase study. Review of the MIP images confirms the above findings. NON-VASCULAR Hepatobiliary: No focal liver abnormality is seen. Status post cholecystectomy. No biliary dilatation. Pancreas: No ductal dilatation or inflammation. Spleen: Normal in size without focal abnormality. Adrenals/Urinary Tract: Adrenal glands are unremarkable. Kidneys are normal, without evidence renal calculi, focal lesion, or hydronephrosis. Bladder is unremarkable. Stomach/Bowel: Stomach is decompressed. No bowel thickening, nondistention or inflammation. Normal appendix. Lymphatic: No adenopathy. Reproductive: Central prostatic calcifications. Other: No free air, free fluid, or intra-abdominal fluid collection. Tiny fat containing umbilical hernia. Musculoskeletal: There are no acute or suspicious osseous abnormalities. Review of the MIP images confirms the above findings. IMPRESSION: Normal thoracoabdominal aorta. No acute abnormality in the chest, abdomen, or pelvis. Electronically Signed   By: Rubye OaksMelanie  Ehinger M.D.   On: 05/05/2016 03:47     ASSESSMENT AND PLAN: Atypical chest pain most likely musculoskeletal and status post sinus bradycardia due to reaction to Zofran and morphine. Patient has no further chest pain can be discharged with follow-up in the office tomorrow at 9:30 and will set up patient for outpatient echo and stress test.  Andry Bogden A

## 2016-05-05 NOTE — ED Notes (Signed)
Pts VS within normal limits. Pt to stay placed on cardiac monitor/pads.

## 2016-05-05 NOTE — H&P (Signed)
Brian Daniel is an 50 y.o. male.   Chief Complaint: Chest pain HPI: The patient with past medical history of high blood pressure presents to the emergency department complaining of chest pain. The pain began in the center of his chest approximately 12 hours prior to admission. He has had similar pain in the past and is able to flex his rib cage and that is audible to other people in the room that usually resolves his chest pain. Today the pain did not resolve with this maneuver and continued in varying severity from onset to the time of our interview in the emergency department. He admits to some shortness of breath associated with the pain but denies nausea, vomiting or diaphoresis. The pain does not radiate. Laboratory evaluation the emergency department was significant for 2 negative cardiac enzymes. In the absence of EKG changes the emergency department staff was preparing to discharge the patient when he had an episode of bradycardia to the low 30s. He had received morphine 4 mg and Zofran 4 mg approximately one hour before this episode. The patient became hypotensive with a systolic pressure of approximately 60. The episode resolved spontaneously. Due to his symptomatic bradycardia and ongoing chest pain the emergency department staff called the hospitalist service for admission.  Past Medical History:  Diagnosis Date  . Diabetes mellitus without complication ([place])   . Hypertension   . TIA (transient ischemic attack)     Past Surgical History:  Procedure Laterality Date  . CHOLECYSTECTOMY    . JOINT REPLACEMENT      No family history on file. None that he is aware Social History:  reports that he has never smoked. He has never used smokeless tobacco. He reports that he drinks alcohol. He reports that he does not use drugs.  Allergies: No Known Allergies  Medications Prior to Admission  Medication Sig Dispense Refill  . lisinopril-hydrochlorothiazide (PRINZIDE,ZESTORETIC) 10-12.5 MG  tablet Take 1 tablet by mouth daily.    . metFORMIN (GLUCOPHAGE) 500 MG tablet Take 500 mg by mouth 2 (two) times daily with a meal.    . Multiple Vitamins-Minerals (MULTIVITAMIN WITH MINERALS) tablet Take 1 tablet by mouth daily.    . tadalafil (CIALIS) 5 MG tablet Take 5 mg by mouth daily as needed for erectile dysfunction.      Results for orders placed or performed during the hospital encounter of 05/04/16 (from the past 48 hour(s))  Basic metabolic panel     Status: Abnormal   Collection Time: 05/04/16  9:10 PM  Result Value Ref Range   Sodium 133 (L) 135 - 145 mmol/L   Potassium 3.1 (L) 3.5 - 5.1 mmol/L   Chloride 100 (L) 101 - 111 mmol/L   CO2 24 22 - 32 mmol/L   Glucose, Bld 350 (H) 65 - 99 mg/dL   BUN 12 6 - 20 mg/dL   Creatinine, Ser 1.04 0.61 - 1.24 mg/dL   Calcium 9.3 8.9 - 10.3 mg/dL   GFR calc non Af Amer >60 >60 mL/min   GFR calc Af Amer >60 >60 mL/min    Comment: (NOTE) The eGFR has been calculated using the CKD EPI equation. This calculation has not been validated in all clinical situations. eGFR's persistently <60 mL/min signify possible Chronic Kidney Disease.    Anion gap 9 5 - 15  CBC     Status: None   Collection Time: 05/04/16  9:10 PM  Result Value Ref Range   WBC 6.6 3.8 - 10.6 K/uL  RBC 5.26 4.40 - 5.90 MIL/uL   Hemoglobin 16.0 13.0 - 18.0 g/dL   HCT 46.1 40.0 - 52.0 %   MCV 87.6 80.0 - 100.0 fL   MCH 30.5 26.0 - 34.0 pg   MCHC 34.8 32.0 - 36.0 g/dL   RDW 12.8 11.5 - 14.5 %   Platelets 196 150 - 440 K/uL  Troponin I     Status: None   Collection Time: 05/04/16  9:10 PM  Result Value Ref Range   Troponin I <0.03 <0.03 ng/mL  Fibrin derivatives D-Dimer     Status: None   Collection Time: 05/05/16 12:40 AM  Result Value Ref Range   Fibrin derivatives D-dimer ([facility]) 203 0 - 499    Comment: <> Exclusion of Venous Thromboembolism (VTE) - OUTPATIENTS ONLY        (Emergency Department or [place])             0-499 ng/ml (FEU)  : With a low to  intermediate pretest                                        probability for VTE this test result                                        excludes the diagnosis of VTE.           > 499 ng/ml (FEU)  : VTE not excluded.  Additional work up                                   for VTE is required.   <>  Testing on Inpatients and Evaluation of Disseminated Intravascular        Coagulation ([facility])             Reference Range:   0-499 ng/ml (FEU)   Troponin I     Status: None   Collection Time: 05/05/16 12:40 AM  Result Value Ref Range   Troponin I <0.03 <0.03 ng/mL  Magnesium     Status: None   Collection Time: 05/05/16 12:40 AM  Result Value Ref Range   Magnesium 2.0 1.7 - 2.4 mg/dL  Glucose, capillary     Status: Abnormal   Collection Time: 05/05/16  1:28 AM  Result Value Ref Range   Glucose-Capillary 284 (H) 65 - 99 mg/dL  Glucose, capillary     Status: Abnormal   Collection Time: 05/05/16  6:43 AM  Result Value Ref Range   Glucose-Capillary 256 (H) 65 - 99 mg/dL   Dg Chest 2 View  Result Date: 05/04/2016 CLINICAL DATA:  Worsening chest pain beginning this afternoon. History of diabetes and hypertension. EXAM: CHEST  2 VIEW COMPARISON:  None. FINDINGS: Cardiomediastinal silhouette is unremarkable for this low inspiratory examination with crowded vasculature markings. The lungs are clear without pleural effusions or focal consolidations. Mildly elevated RIGHT hemidiaphragm. Trachea projects midline and there is no pneumothorax. Included soft tissue planes and osseous structures are non-suspicious. Surgical clips in the included right abdomen compatible with cholecystectomy. IMPRESSION: No acute cardiopulmonary process. Electronically Signed   By: Elon Alas M.D.   On: 05/04/2016 21:57   Ct Angio Chest/abd/pel For Dissection W And/or Wo Contrast  Result Date:  05/05/2016 CLINICAL DATA:  Progressive chest pain. EXAM: CT ANGIOGRAPHY CHEST, ABDOMEN AND PELVIS TECHNIQUE: Multidetector CT  imaging through the chest, abdomen and pelvis was performed using the standard protocol during bolus administration of intravenous contrast. Multiplanar reconstructed images and MIPs were obtained and reviewed to evaluate the vascular anatomy. CONTRAST:  125 cc Isovue 370 IV COMPARISON:  Chest radiograph yesterday. FINDINGS: CTA CHEST FINDINGS Cardiovascular: Normal caliber thoracic aorta without dissection, hematoma, aneurysm or acute aortic abnormality. Conventional branching pattern from the aortic arch. There are no filling defects in the central pulmonary arteries to the proximal segmental level to suggest pulmonary embolus. The heart is normal in size. No pericardial effusion. Mediastinum/Nodes: No mediastinal, hilar, or axillary adenopathy. Visualized thyroid gland is normal. The esophagus is decompressed. Lungs/Pleura: Scattered linear and dependent atelectasis throughout both lungs. No confluent airspace disease to suggest pneumonia. No pulmonary edema. No pulmonary mass or suspicious nodule. No pleural fluid. Musculoskeletal: There are no acute or suspicious osseous abnormalities. Review of the MIP images confirms the above findings. CTA ABDOMEN AND PELVIS FINDINGS VASCULAR Aorta: Normal caliber aorta without aneurysm, dissection, vasculitis or significant stenosis. Celiac: Patent without evidence of aneurysm, dissection, vasculitis or significant stenosis. SMA: Patent without evidence of aneurysm, dissection, vasculitis or significant stenosis. Renals: Patent without significant stenosis, aneurysm or vasculitis. Accessory lower pole right renal artery arises from the distal aorta. IMA: Patent without evidence of aneurysm, dissection, vasculitis or significant stenosis. Inflow: Patent without evidence of aneurysm, dissection, vasculitis or significant stenosis. Veins: No obvious venous abnormality within the limitations of this arterial phase study. Review of the MIP images confirms the above findings.  NON-VASCULAR Hepatobiliary: No focal liver abnormality is seen. Status post cholecystectomy. No biliary dilatation. Pancreas: No ductal dilatation or inflammation. Spleen: Normal in size without focal abnormality. Adrenals/Urinary Tract: Adrenal glands are unremarkable. Kidneys are normal, without evidence renal calculi, focal lesion, or hydronephrosis. Bladder is unremarkable. Stomach/Bowel: Stomach is decompressed. No bowel thickening, nondistention or inflammation. Normal appendix. Lymphatic: No adenopathy. Reproductive: Central prostatic calcifications. Other: No free air, free fluid, or intra-abdominal fluid collection. Tiny fat containing umbilical hernia. Musculoskeletal: There are no acute or suspicious osseous abnormalities. Review of the MIP images confirms the above findings. IMPRESSION: Normal thoracoabdominal aorta. No acute abnormality in the chest, abdomen, or pelvis. Electronically Signed   By: Jeb Levering M.D.   On: 05/05/2016 03:47    Review of Systems  Constitutional: Negative for chills and fever.  HENT: Negative for sore throat and tinnitus.   Eyes: Negative for blurred vision and redness.  Respiratory: Negative for cough and shortness of breath.   Cardiovascular: Positive for chest pain. Negative for palpitations, orthopnea and PND.  Gastrointestinal: Negative for abdominal pain, diarrhea, nausea and vomiting.  Genitourinary: Negative for dysuria, frequency and urgency.  Musculoskeletal: Negative for joint pain and myalgias.  Skin: Negative for rash.       No lesions  Neurological: Negative for speech change, focal weakness and weakness.  Endo/Heme/Allergies: Does not bruise/bleed easily.       No temperature intolerance  Psychiatric/Behavioral: Negative for depression and suicidal ideas.    Blood pressure (!) 151/99, pulse 87, temperature 97.8 F (36.6 C), temperature source Oral, resp. rate 18, height '5\' 9"'  (1.753 m), weight 93.6 kg (206 lb 6.4 oz), SpO2 94  %. Physical Exam  Nursing note and vitals reviewed. Constitutional: He is oriented to person, place, and time. He appears well-developed and well-nourished. No distress.  HENT:  Head: Normocephalic and atraumatic.  Mouth/Throat: Oropharynx  is clear and moist.  Eyes: Conjunctivae and EOM are normal. Pupils are equal, round, and reactive to light. No scleral icterus.  Neck: Normal range of motion. Neck supple. No JVD present. No tracheal deviation present. No thyromegaly present.  Cardiovascular: Normal rate, regular rhythm and normal heart sounds.  Exam reveals no gallop and no friction rub.   No murmur heard. Respiratory: Effort normal and breath sounds normal. No respiratory distress.  GI: Soft. Bowel sounds are normal. He exhibits no distension. There is no tenderness.  Genitourinary:  Genitourinary Comments: Deferred  Musculoskeletal: Normal range of motion. He exhibits no edema.  Lymphadenopathy:    He has no cervical adenopathy.  Neurological: He is alert and oriented to person, place, and time. No cranial nerve deficit.  Skin: Skin is warm and dry. No rash noted. No erythema.  Psychiatric: He has a normal mood and affect. His behavior is normal. Judgment and thought content normal.     Assessment/Plan This is a 50 year old male admitted for atypical chest pain. 1. Chest pain: Atypical in duration and character. Associated with an isolated episode of symptomatic bradycardia. The patient admits to a similar incident nearly 20 years ago in which he passed out in his living room. His episode in the emergency department prompted emergent CTA of the chest to rule out pulmonary embolism and dissection. The patient stabilized without intervention. Since that time telemetry has been unremarkable but the patient's pain persists. I have ordered a cardiology consult and we will continue to follow cardiac biomarkers. 2. Hypertension: Reasonably controlled at this time; continue lisinopril with  hydrochlorothiazide. 3. Diabetes mellitus type 2: Hold metformin; I placed patient on sliding-scale insulin while hospitalized. 4. DVT prophylaxis: Lovenox 5. GI prophylaxis: None Exline the patient is a full code. Time spent on admission orders and patient care approximately 45 minutes   Harrie Foreman, MD 05/05/2016, 6:57 AM

## 2016-05-05 NOTE — Progress Notes (Signed)
Removed telemetry and removed PIV.  Rx called into [facility].  No questions at this time.  Plan to discharge out of hospital via hospital by volunteers.

## 2016-05-06 LAB — HEMOGLOBIN A1C
Hgb A1c MFr Bld: 9.4 % — ABNORMAL HIGH (ref 4.8–5.6)
Mean Plasma Glucose: 223 mg/dL

## 2016-10-19 ENCOUNTER — Encounter (HOSPITAL_COMMUNITY): Payer: Self-pay | Admitting: Family Medicine

## 2016-10-19 ENCOUNTER — Observation Stay (HOSPITAL_COMMUNITY)
Admission: EM | Admit: 2016-10-19 | Discharge: 2016-10-20 | Disposition: A | Discharge status: Home / Self Care | Payer: BLUE CROSS/BLUE SHIELD | Attending: Internal Medicine | Admitting: Internal Medicine

## 2016-10-19 ENCOUNTER — Emergency Department (HOSPITAL_COMMUNITY): Payer: BLUE CROSS/BLUE SHIELD

## 2016-10-19 DIAGNOSIS — Z8673 Personal history of transient ischemic attack (TIA), and cerebral infarction without residual deficits: Secondary | ICD-10-CM | POA: Insufficient documentation

## 2016-10-19 DIAGNOSIS — E876 Hypokalemia: Secondary | ICD-10-CM | POA: Insufficient documentation

## 2016-10-19 DIAGNOSIS — I639 Cerebral infarction, unspecified: Secondary | ICD-10-CM

## 2016-10-19 DIAGNOSIS — R944 Abnormal results of kidney function studies: Secondary | ICD-10-CM | POA: Diagnosis not present

## 2016-10-19 DIAGNOSIS — R17 Unspecified jaundice: Secondary | ICD-10-CM | POA: Diagnosis present

## 2016-10-19 DIAGNOSIS — E871 Hypo-osmolality and hyponatremia: Secondary | ICD-10-CM | POA: Diagnosis not present

## 2016-10-19 DIAGNOSIS — E119 Type 2 diabetes mellitus without complications: Secondary | ICD-10-CM | POA: Diagnosis not present

## 2016-10-19 DIAGNOSIS — Z823 Family history of stroke: Secondary | ICD-10-CM | POA: Diagnosis not present

## 2016-10-19 DIAGNOSIS — Z79899 Other long term (current) drug therapy: Secondary | ICD-10-CM | POA: Diagnosis not present

## 2016-10-19 DIAGNOSIS — Z7984 Long term (current) use of oral hypoglycemic drugs: Secondary | ICD-10-CM | POA: Diagnosis not present

## 2016-10-19 DIAGNOSIS — H538 Other visual disturbances: Secondary | ICD-10-CM | POA: Diagnosis present

## 2016-10-19 DIAGNOSIS — G459 Transient cerebral ischemic attack, unspecified: Secondary | ICD-10-CM | POA: Diagnosis not present

## 2016-10-19 DIAGNOSIS — E663 Overweight: Secondary | ICD-10-CM | POA: Diagnosis not present

## 2016-10-19 DIAGNOSIS — E785 Hyperlipidemia, unspecified: Secondary | ICD-10-CM | POA: Insufficient documentation

## 2016-10-19 DIAGNOSIS — I10 Essential (primary) hypertension: Secondary | ICD-10-CM | POA: Diagnosis not present

## 2016-10-19 DIAGNOSIS — Z6829 Body mass index (BMI) 29.0-29.9, adult: Secondary | ICD-10-CM | POA: Insufficient documentation

## 2016-10-19 DIAGNOSIS — Z7982 Long term (current) use of aspirin: Secondary | ICD-10-CM | POA: Diagnosis not present

## 2016-10-19 DIAGNOSIS — E1165 Type 2 diabetes mellitus with hyperglycemia: Secondary | ICD-10-CM | POA: Insufficient documentation

## 2016-10-19 LAB — COMPREHENSIVE METABOLIC PANEL
ALBUMIN: 4.3 g/dL (ref 3.5–5.0)
ALT: 30 U/L (ref 17–63)
AST: 22 U/L (ref 15–41)
Alkaline Phosphatase: 59 U/L (ref 38–126)
Anion gap: 7 (ref 5–15)
BUN: 9 mg/dL (ref 6–20)
CHLORIDE: 101 mmol/L (ref 101–111)
CO2: 26 mmol/L (ref 22–32)
Calcium: 9.3 mg/dL (ref 8.9–10.3)
Creatinine, Ser: 1.19 mg/dL (ref 0.61–1.24)
GFR calc Af Amer: >60 mL/min (ref >60)
GFR calc non Af Amer: >60 mL/min (ref >60)
GLUCOSE: 255 mg/dL — AB (ref 65–99)
POTASSIUM: 3.4 mmol/L — AB (ref 3.5–5.1)
Sodium: 134 mmol/L — ABNORMAL LOW (ref 135–145)
Total Bilirubin: 2.3 mg/dL — ABNORMAL HIGH (ref 0.3–1.2)
Total Protein: 6.9 g/dL (ref 6.5–8.1)

## 2016-10-19 LAB — DIFFERENTIAL
BASOS ABS: 0 10*3/uL (ref 0.0–0.1)
BASOS PCT: 0 %
EOS ABS: 0.1 10*3/uL (ref 0.0–0.7)
Eosinophils Relative: 1 %
LYMPHS ABS: 2.1 10*3/uL (ref 0.7–4.0)
Lymphocytes Relative: 28 %
MONOS PCT: 6 %
Monocytes Absolute: 0.4 10*3/uL (ref 0.1–1.0)
NEUTROS ABS: 4.8 10*3/uL (ref 1.7–7.7)
NEUTROS PCT: 65 %

## 2016-10-19 LAB — CBC
HEMATOCRIT: 45.6 % (ref 39.0–52.0)
HEMOGLOBIN: 16.5 g/dL (ref 13.0–17.0)
MCH: 31.6 pg (ref 26.0–34.0)
MCHC: 36.2 g/dL — AB (ref 30.0–36.0)
MCV: 87.4 fL (ref 78.0–100.0)
Platelets: 183 10*3/uL (ref 150–400)
RBC: 5.22 MIL/uL (ref 4.22–5.81)
RDW: 12.4 % (ref 11.5–15.5)
WBC: 7.5 10*3/uL (ref 4.0–10.5)

## 2016-10-19 LAB — URINALYSIS, ROUTINE W REFLEX MICROSCOPIC
Bacteria, UA: NONE SEEN
Bilirubin Urine: NEGATIVE
Glucose, UA: >=500 mg/dL — AB
Hgb urine dipstick: NEGATIVE
KETONES UR: NEGATIVE mg/dL
Leukocytes, UA: NEGATIVE
Nitrite: NEGATIVE
PH: 6 (ref 5.0–8.0)
Protein, ur: NEGATIVE mg/dL
RBC / HPF: NONE SEEN RBC/hpf (ref 0–5)
SPECIFIC GRAVITY, URINE: 1.01 (ref 1.005–1.030)
SQUAMOUS EPITHELIAL / LPF: NONE SEEN

## 2016-10-19 LAB — APTT: APTT: 31 s (ref 24–36)

## 2016-10-19 LAB — RAPID URINE DRUG SCREEN, HOSP PERFORMED
AMPHETAMINES: NOT DETECTED
BARBITURATES: NOT DETECTED
BENZODIAZEPINES: NOT DETECTED
COCAINE: NOT DETECTED
Opiates: NOT DETECTED
TETRAHYDROCANNABINOL: NOT DETECTED

## 2016-10-19 LAB — I-STAT TROPONIN, ED: Troponin i, poc: 0 ng/mL (ref 0.00–0.08)

## 2016-10-19 LAB — I-STAT CHEM 8, ED
BUN: 11 mg/dL (ref 6–20)
CHLORIDE: 97 mmol/L — AB (ref 101–111)
CREATININE: 1.1 mg/dL (ref 0.61–1.24)
Calcium, Ion: 1.14 mmol/L — ABNORMAL LOW (ref 1.15–1.40)
GLUCOSE: 249 mg/dL — AB (ref 65–99)
HEMATOCRIT: 47 % (ref 39.0–52.0)
HEMOGLOBIN: 16 g/dL (ref 13.0–17.0)
POTASSIUM: 3.4 mmol/L — AB (ref 3.5–5.1)
Sodium: 137 mmol/L (ref 135–145)
TCO2: 26 mmol/L (ref 0–100)

## 2016-10-19 LAB — ETHANOL: Alcohol, Ethyl (B): <5 mg/dL (ref <5)

## 2016-10-19 LAB — CBG MONITORING, ED
GLUCOSE-CAPILLARY: 265 mg/dL — AB (ref 65–99)
Glucose-Capillary: 243 mg/dL — ABNORMAL HIGH (ref 65–99)

## 2016-10-19 LAB — PROTIME-INR
INR: 1.02
Prothrombin Time: 13.4 seconds (ref 11.4–15.2)

## 2016-10-19 NOTE — ED Notes (Signed)
Per Dr. Roseanne RenoStewart, not giving tPA at this time, symptoms mild, improving.

## 2016-10-19 NOTE — ED Triage Notes (Signed)
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 Upon EMS arrival, pt appeared to have slurred speech, L facial droop, and L sided weakness. Grips initially unequal and pt had some difficulty following commands. Hx TIA. Patient currently A&O x 4, symptoms appear to be improving. CBG 219 with EMS. Patient denies pain.

## 2016-10-19 NOTE — H&P (Signed)
History and Physical  Patient Name: Brian Daniel     ZOX:[id number]    DOB: Oct 03, 1965    DOA: 10/19/2016 PCP: Coralee Rud, PA-C   Patient coming from: Car     Chief Complaint: Dizziness, slurred speech  HPI: Brian Daniel is a 51 y.o. male with a past medical history significant for HTN and DM and remote TIA reportedly who presents with acute dizziness.  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  There was no LOC or passing out, no chest pain, headache, abdominal pain, pain anywhere. He also felt like his speech was slurred so he called a friend and she called 9-1-1.  When EMS arrived, they reported that the patient had left sided weakness and left sided facial droop and slurred speech.  ED course: -Symptoms had resolved, but patient was very sleepy -Afebrile, heart rate 80, respirations pulse is normal, blood pressure 154/90 -Na 134, K 3.4, Cr 1.19, WBC 7.5K, Hgb 16.5 -Alcohol negative -Coags negative -CXR clear -CTA chest abdomen and pelvis unremarkable -CT head unremarkable -Neurology evaluated the patient as a CODE STROKE but deferred tPA because of rapidly resolving symptoms and asked that patient be admitted to medicine service for TIA workup   Patient thinks he had a mini-stroke or TIA some years ago, but can't remember details.         Review of systems:  Review of Systems  Constitutional: Negative for chills, fever and malaise/fatigue.  Neurological: Positive for dizziness, speech change and focal weakness. Negative for tingling, sensory change, seizures, loss of consciousness and headaches.         Past Medical History:  Diagnosis Date  . Diabetes mellitus without  complication ([facility])   . Hypertension   . TIA (transient ischemic attack)     Past Surgical History:  Procedure Laterality Date  . CHOLECYSTECTOMY    . JOINT REPLACEMENT      Social History: Patient lives with his mother at present.  Patient walks unassisted.  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  Lives in [place].  Does not smoke.  No alcohol.    No Known Allergies  Family history: family history includes COPD in his mother; Stroke in his mother.  Prior to Admission medications   Medication Sig Start Date End Date Taking? Authorizing Provider  buPROPion (WELLBUTRIN XL) 150 MG 24 hr tablet Take 150 mg by mouth daily. 09/28/16  Yes [provider]  liraglutide (VICTOZA) 18 MG/3ML SOPN Inject 18 mg into the skin daily.   Yes [provider]  lisinopril-hydrochlorothiazide (PRINZIDE,ZESTORETIC) 10-12.5 MG tablet Take 1 tablet by mouth daily.   Yes [provider]  sitaGLIPtin-metformin (JANUMET) 50-500 MG tablet Take 1 tablet by mouth 2 (two) times daily with a meal.   Yes [provider]  tamsulosin (FLOMAX) 0.4 MG CAPS capsule Take 0.4 mg by mouth daily. 09/28/16  Yes [provider]  Multiple Vitamins-Minerals (MULTIVITAMIN WITH MINERALS) tablet Take 1 tablet by mouth daily.    [provider]  tadalafil (CIALIS) 5 MG tablet Take 5 mg by mouth daily as needed for erectile dysfunction.    [provider]     Physical Exam: BP (!) 142/86   Pulse  81   Temp 98 F (36.7 C)   Resp 12   Wt 89.6 kg (197 lb 8.5 oz)   SpO2 99%   BMI 29.17 kg/m  General appearance: Well-developed, adult male, alert and in no acute distress, very sleepy.   Eyes: Anicteric, conjunctiva pink, lids and lashes normal. PERRL.  Right ptosis is old, (scar above right eye) ENT: No nasal deformity, discharge, epistaxis.  Hearing normal. OP tacky dry without lesions.   Dentition negative. Lymph: No cervical, supraclavicular or axillary  lymphadenopathy. Skin: Warm and dry.  No jaundice.  No suspicious rashes or lesions. Cardiac: RRR, nl S1-S2, no murmurs appreciated.  Capillary refill is brisk.  JVP normal.  No LE edema.  Radial and DP pulses 2+ and symmetric.   Respiratory: Normal respiratory rate and rhythm.  CTAB without rales or wheezes. GI: Abdomen soft without rigidity.  No TTP. No ascites, distension, no hepatosplenomegaly.   MSK: No deformities or effusions. Neuro: Pupils are 4 mm and reactive to 3 mm. Extraocular movements are intact, without nystagmus. Cranial nerve 5 is within normal limits. Cranial nerve 7 is not quite symmetric, seems to be left droop, ?effort. Cranial nerve 8 is within normal limits. Cranial nerves 9 and 10 reveal equal palate elevation. Cranial nerve 11 reveals sternocleidomastoid strong. Cranial nerve 12 is midline. He is slow to raise left arm, it also drifts.  Leg strength is 5/5 and equal.  Patellar reflexes are diminished bilaterally.  No deficits to sensory exam. Speech is fluent. Naming is grossly intact. Attention span and concentration are within normal limits.   Psych: The patient is oriented to time, place and person. Behavior appropriate.  Affect blunted.  Recall, recent and remote, as well as general fund of knowledge seem within normal limits. No evidence of aural or visual hallucinations or delusions.       Labs on Admission:  I have personally reviewed following labs and imaging studies: CBC:  Recent Labs Lab 10/19/16 2215 10/19/16 2218  WBC 7.5  --   NEUTROABS 4.8  --   HGB 16.5 16.0  HCT 45.6 47.0  MCV 87.4  --   PLT 183  --    Basic Metabolic Panel:  Recent Labs Lab 10/19/16 2215 10/19/16 2218  NA 134* 137  K 3.4* 3.4*  CL 101 97*  CO2 26  --   GLUCOSE 255* 249*  BUN 9 11  CREATININE 1.19 1.10  CALCIUM 9.3  --    GFR: Estimated Creatinine Clearance: 88 mL/min (by C-G formula based on SCr of 1.1 mg/dL). Liver Function Tests:  Recent Labs Lab  10/19/16 2215  AST 22  ALT 30  ALKPHOS 59  BILITOT 2.3*  PROT 6.9  ALBUMIN 4.3   No results for input(s): LIPASE, AMYLASE in the last 168 hours. No results for input(s): AMMONIA in the last 168 hours. Coagulation Profile:  Recent Labs Lab 10/19/16 2215  INR 1.02   Cardiac Enzymes: No results for input(s): CKTOTAL, CKMB, CKMBINDEX, TROPONINI in the last 168 hours. BNP (last 3 results) No results for input(s): PROBNP in the last 8760 hours. HbA1C: No results for input(s): HGBA1C in the last 72 hours. CBG:  Recent Labs Lab 10/19/16 2212 10/19/16 2258  GLUCAP 243* 265*   Lipid Profile: No results for input(s): CHOL, HDL, LDLCALC, TRIG, CHOLHDL, LDLDIRECT in the last 72 hours. Thyroid Function Tests: No results for input(s): TSH, T4TOTAL, FREET4, T3FREE, THYROIDAB in the last 72 hours. Anemia Panel: No results for input(s): VITAMINB12, FOLATE,  FERRITIN, TIBC, IRON, RETICCTPCT in the last 72 hours. Sepsis Labs:  Invalid input(s): PROCALCITONIN, LACTICIDVEN No results found for this or any previous visit (from the past 240 hour(s)).    Radiological Exams on Admission: Personally reviewed CXR shows right hemidiaphragm elevation, no airspace disease; CTA chest abdomen pelvis and CT head reports reviewed: Ct Head Code Stroke W/o Cm  Result Date: 10/19/2016 CLINICAL DATA:  Code stroke. Confusion while driving. LEFT-sided weakness. History of hypertension and diabetes. Assess stroke. EXAM: CT HEAD WITHOUT CONTRAST TECHNIQUE: Contiguous axial images were obtained from the base of the skull through the vertex without intravenous contrast. COMPARISON:  MRI head January 21, 2012 FINDINGS: BRAIN: No intraparenchymal hemorrhage, mass effect nor midline shift. The ventricles and sulci are normal. No acute large vascular territory infarcts. A few scattered subcentimeter supratentorial white matter hypodensities compatible with early chronic small vessel ischemic disease. Old small LEFT  cerebellar infarct. No abnormal extra-axial fluid collections. Basal cisterns are patent. VASCULAR: Unremarkable. SKULL/SOFT TISSUES: No skull fracture. No significant soft tissue swelling. ORBITS/SINUSES: The included ocular globes and orbital contents are normal.Lobulated RIGHT max sinus mucosal thickening. Probable follow-up RIGHT middle turbinate . Mastoid air cells are well aerated. OTHER: None. ASPECTS [facility] Stroke Program Early CT Score) - Ganglionic level infarction (caudate, lentiform nuclei, internal capsule, insula, M1-M3 cortex): 7 - Supraganglionic infarction (M4-M6 cortex): 3 Total score (0-10 with 10 being normal): 10 IMPRESSION: 1. No acute intracranial process. Old small LEFT cerebellar infarct and, minimal chronic small vessel ischemic disease. 2. ASPECTS is 10. Critical Value/emergent results were called by telephone at the time of interpretation on 10/19/2016 at 10:28 pm to Dr. Roseanne Reno, Neurology, who verbally acknowledged these results. Electronically Signed   By: Awilda Metro M.D.   On: 10/19/2016 22:29     EKG: Independently reviewed. Rate 85, QTc 432, normal sinus.           Assessment/Plan  1. Acute TIA:  This is new.  MRI pending.  ABCD 5.   -Admit to telemetry -Neuro checks, NIHSS per protocol -Increase aspirin from 81 to 325 mg -Lipids, hemoglobin A1c in AM -Carotid doppler, MRI/MRA brain/head ordered -Echocardiogram ordered -PT/OT consultation -Consult to Neurology, appreciate recommendations   2. Elevated bilirubin:  Unclear etiology.  Transaminases normal. -Fractionate bili -Trend Tbili  3. HTN:  Hypertensive at admission -Continue lisinopril-HCTZ  4. Diabetes:  -Hold orals and Victoza -SSI with meals  5. Other medications:  -Continue wellbutrin          DVT prophylaxis: Lovenox  Code Status: FULL  Family Communication: None presetn  Disposition Plan: Anticipate Stroke work up as above and consult to ancillary services.   Expect discharge within 1 day. Consults called: Neurology, Dr. Roseanne Reno has seen patient. Admission status: Telemetry, OBS status  Core measures: -VTE prophylaxis ordered at time of admission -Aspirin ordered at admission -Atrial fibrillation: Not present -tPA not given because of symptoms resolving -Dysphagia screen ordered in ER -Lipids ordered -PT eval ordered -Nonsmoker    Medical decision making: Patient seen at 11:20 PM on 10/19/2016.  The patient was discussed with Ivar Drape, PA-C. What exists of the patient's chart was reviewed in depth and summarized above.  Clinical condition: stable.       Alberteen Sam [facility] Pager [phone]

## 2016-10-19 NOTE — Progress Notes (Signed)
Brian AcostaMichael Daniel is a 51 year old male, approx 2120 while driving his eyes began to feel like they were crossing, acute on set of weakness, and dizziness. 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 CBG 219, NIH 3. Pt has a history of TIA. Pt to be admitted to [facility] for TIA work up.

## 2016-10-19 NOTE — ED Provider Notes (Signed)
[facility]-EMERGENCY DEPT Provider Note   CSN: [id number] Arrival date & time: 10/19/16  2210   An emergency department physician performed an initial assessment on this suspected stroke patient at 2210.  History   Chief Complaint Chief Complaint  Patient presents with  . Code Stroke    HPI Brian AcostaMichael Wenzl is a 51 y.o. male.  Patient with past medical history of TIA, hypertension, diabetes presents to the emergency department with chief complaint of double vision. He states that he had an episode of double vision earlier this evening while driving. He pulled over and called 911. Onset of his symptoms was about 90 minutes ago. EMS transported the patient here under code stroke protocol.  They noted that he was experiencing some slurred speech and left sided facial droop and left upper and lower extremity weakness.  Patient denies any headache, chest pain, shortness of breath.   The history is provided by the patient. No language interpreter was used.    Past Medical History:  Diagnosis Date  . Diabetes mellitus without complication (HCC)   . Hypertension   . TIA (transient ischemic attack)     Patient Active Problem List   Diagnosis Date Noted  . Chest pain 05/05/2016  . Atypical chest pain 05/05/2016    Past Surgical History:  Procedure Laterality Date  . CHOLECYSTECTOMY    . JOINT REPLACEMENT         Home Medications    Prior to Admission medications   Medication Sig Start Date End Date Taking? Authorizing Provider  aspirin EC 81 MG tablet Take 1 tablet (81 mg total) by mouth daily. 05/05/16   Enedina FinnerPatel, Sona, MD  lisinopril-hydrochlorothiazide (PRINZIDE,ZESTORETIC) 10-12.5 MG tablet Take 1 tablet by mouth daily.    [provider]  metFORMIN (GLUCOPHAGE) 500 MG tablet Take 500 mg by mouth 2 (two) times daily with a meal.    [provider]  Multiple Vitamins-Minerals (MULTIVITAMIN WITH MINERALS) tablet Take 1 tablet by mouth daily.    [provider]  tadalafil (CIALIS) 5 MG tablet Take 5 mg by mouth daily as needed for erectile dysfunction.    [provider]    Family History No family history on file.  Social History Social History  Substance Use Topics  . Smoking status: Never Smoker  . Smokeless tobacco: Never Used  . Alcohol use Yes     Comment: occ     Allergies   Patient has no known allergies.   Review of Systems Review of Systems  All other systems reviewed and are negative.    Physical Exam Updated Vital Signs BP (!) 160/96 (BP Location: Right Arm)   Pulse 75   Temp 98 F (36.7 C) (Oral)   Resp 16   Wt 89.6 kg (197 lb 8.5 oz)   SpO2 98%   BMI 29.17 kg/m   Physical Exam  Constitutional: He is oriented to person, place, and time. He appears well-developed and well-nourished.  HENT:  Head: Normocephalic and atraumatic.  Eyes: Conjunctivae and EOM are normal. Pupils are equal, round, and reactive to light. Right eye exhibits no discharge. Left eye exhibits no discharge. No scleral icterus.  Neck: Normal range of motion. Neck supple. No JVD present.  Cardiovascular: Normal rate, regular rhythm and normal heart sounds.  Exam reveals no gallop and no friction rub.   No murmur heard. Pulmonary/Chest: Effort normal and breath sounds normal. No respiratory distress. He has no wheezes. He has no rales. He exhibits no tenderness.  Abdominal: Soft. He exhibits no distension and no mass. There is no tenderness. There is no rebound and no guarding.  Musculoskeletal: Normal range of motion. He exhibits no edema or tenderness.  Neurological: He is alert and oriented to person, place, and time.  Noticeable left-sided facial droop and some slurred speech, left upper and lower extremity strength is diminished, seems to have decreased effort  Skin: Skin is warm and dry.  Psychiatric: He has a normal mood and affect. His behavior is normal. Judgment and thought content normal.  Nursing note and vitals  reviewed.    ED Treatments / Results  Labs (all labs ordered are listed, but only abnormal results are displayed) Labs Reviewed  CBC - Abnormal; Notable for the following:       Result Value   MCHC 36.2 (*)    All other components within normal limits  COMPREHENSIVE METABOLIC PANEL - Abnormal; Notable for the following:    Sodium 134 (*)    Potassium 3.4 (*)    Glucose, Bld 255 (*)    Total Bilirubin 2.3 (*)    All other components within normal limits  I-STAT CHEM 8, ED - Abnormal; Notable for the following:    Potassium 3.4 (*)    Chloride 97 (*)    Glucose, Bld 249 (*)    Calcium, Ion 1.14 (*)    All other components within normal limits  CBG MONITORING, ED - Abnormal; Notable for the following:    Glucose-Capillary 243 (*)    All other components within normal limits  PROTIME-INR  APTT  DIFFERENTIAL  ETHANOL  RAPID URINE DRUG SCREEN, HOSP PERFORMED  URINALYSIS, ROUTINE W REFLEX MICROSCOPIC  I-STAT TROPOININ, ED    EKG  EKG Interpretation None       Radiology Ct Head Code Stroke W/o Cm  Result Date: 10/19/2016 CLINICAL DATA:  Code stroke. Confusion while driving. LEFT-sided weakness. History of hypertension and diabetes. Assess stroke. EXAM: CT HEAD WITHOUT CONTRAST TECHNIQUE: Contiguous axial images were obtained from the base of the skull through the vertex without intravenous contrast. COMPARISON:  MRI head January 21, 2012 FINDINGS: BRAIN: No intraparenchymal hemorrhage, mass effect nor midline shift. The ventricles and sulci are normal. No acute large vascular territory infarcts. A few scattered subcentimeter supratentorial white matter hypodensities compatible with early chronic small vessel ischemic disease. Old small LEFT cerebellar infarct. No abnormal extra-axial fluid collections. Basal cisterns are patent. VASCULAR: Unremarkable. SKULL/SOFT TISSUES: No skull fracture. No significant soft tissue swelling. ORBITS/SINUSES: The included ocular globes and  orbital contents are normal.Lobulated RIGHT max sinus mucosal thickening. Probable follow-up RIGHT middle turbinate . Mastoid air cells are well aerated. OTHER: None. ASPECTS [facility] Stroke Program Early CT Score) - Ganglionic level infarction (caudate, lentiform nuclei, internal capsule, insula, M1-M3 cortex): 7 - Supraganglionic infarction (M4-M6 cortex): 3 Total score (0-10 with 10 being normal): 10 IMPRESSION: 1. No acute intracranial process. Old small LEFT cerebellar infarct and, minimal chronic small vessel ischemic disease. 2. ASPECTS is 10. Critical Value/emergent results were called by telephone at the time of interpretation on 10/19/2016 at 10:28 pm to Dr. Roseanne Reno, Neurology, who verbally acknowledged these results. Electronically Signed   By: Awilda Metro M.D.   On: 10/19/2016 22:29    Procedures Procedures (including critical care time)  Medications Ordered in ED Medications - No data to display   Initial Impression / Assessment and Plan / ED Course  I have reviewed the triage vital signs and the nursing notes.  Pertinent labs &  imaging results that were available during my care of the patient were reviewed by me and considered in my medical decision making (see chart for details).     Patient with double vision, left-sided facial droop and left upper and lower extremity weakness. Onset was 2100 this evening. Code stroke was activated. CT shows no evidence for acute stroke. Patient seen by Dr. Roseanne Reno from neurology, who advises no TPA given the patient's symptoms are mild and are improving. NIH score is 3. Recommendation per neurology as for admission to internal medicine for TIA workup.  Patient does still have some left upper and lower extremity weakness with stress seems to be more effort dependant.  When I yell at him to resist, he has 5/5 strength.  He then becomes sleepy and effort decreases.  Will check CBG.    VSS.  Plan for admission to medicine.  Appreciate Dr.  Maryfrances Bunnell for admitting the patient.  Final Clinical Impressions(s) / ED Diagnoses   Final diagnoses:  Transient cerebral ischemia, unspecified type    New Prescriptions New Prescriptions   No medications on file     Roxy Horseman, Cordelia Poche 10/19/16 2323    Melene Plan, DO 10/19/16 2335

## 2016-10-20 ENCOUNTER — Observation Stay (HOSPITAL_BASED_OUTPATIENT_CLINIC_OR_DEPARTMENT_OTHER): Payer: BLUE CROSS/BLUE SHIELD

## 2016-10-20 ENCOUNTER — Encounter (HOSPITAL_COMMUNITY): Payer: Self-pay | Admitting: Emergency Medicine

## 2016-10-20 ENCOUNTER — Observation Stay (HOSPITAL_COMMUNITY): Payer: BLUE CROSS/BLUE SHIELD

## 2016-10-20 DIAGNOSIS — I10 Essential (primary) hypertension: Secondary | ICD-10-CM | POA: Diagnosis not present

## 2016-10-20 DIAGNOSIS — I639 Cerebral infarction, unspecified: Secondary | ICD-10-CM

## 2016-10-20 DIAGNOSIS — I360 Nonrheumatic tricuspid (valve) stenosis: Secondary | ICD-10-CM | POA: Diagnosis not present

## 2016-10-20 DIAGNOSIS — G458 Other transient cerebral ischemic attacks and related syndromes: Secondary | ICD-10-CM | POA: Diagnosis not present

## 2016-10-20 DIAGNOSIS — R17 Unspecified jaundice: Secondary | ICD-10-CM

## 2016-10-20 LAB — VAS US CAROTID
LCCAPDIAS: 29 cm/s
LCCAPSYS: 121 cm/s
LEFT ECA DIAS: -18 cm/s
LEFT VERTEBRAL DIAS: 18 cm/s
LICAPDIAS: -29 cm/s
Left CCA dist dias: -30 cm/s
Left CCA dist sys: -96 cm/s
Left ICA dist dias: -34 cm/s
Left ICA dist sys: -76 cm/s
Left ICA prox sys: -68 cm/s
RCCADSYS: -33 cm/s
RIGHT ECA DIAS: -15 cm/s
RIGHT VERTEBRAL DIAS: -18 cm/s
Right CCA prox dias: -29 cm/s
Right CCA prox sys: -109 cm/s

## 2016-10-20 LAB — ECHOCARDIOGRAM COMPLETE
Height: 69 in
Weight: 3152 oz

## 2016-10-20 LAB — CBC
HEMATOCRIT: 43.1 % (ref 39.0–52.0)
HEMOGLOBIN: 15.1 g/dL (ref 13.0–17.0)
MCH: 30.7 pg (ref 26.0–34.0)
MCHC: 35 g/dL (ref 30.0–36.0)
MCV: 87.6 fL (ref 78.0–100.0)
Platelets: 180 10*3/uL (ref 150–400)
RBC: 4.92 MIL/uL (ref 4.22–5.81)
RDW: 12.4 % (ref 11.5–15.5)
WBC: 7.7 10*3/uL (ref 4.0–10.5)

## 2016-10-20 LAB — HIV ANTIBODY (ROUTINE TESTING W REFLEX): HIV Screen 4th Generation wRfx: NONREACTIVE

## 2016-10-20 LAB — GLUCOSE, CAPILLARY
GLUCOSE-CAPILLARY: 225 mg/dL — AB (ref 65–99)
Glucose-Capillary: 274 mg/dL — ABNORMAL HIGH (ref 65–99)
Glucose-Capillary: 219 mg/dL — ABNORMAL HIGH (ref 65–99)
Glucose-Capillary: 257 mg/dL — ABNORMAL HIGH (ref 65–99)

## 2016-10-20 LAB — COMPREHENSIVE METABOLIC PANEL
ALBUMIN: 3.7 g/dL (ref 3.5–5.0)
ALT: 27 U/L (ref 17–63)
AST: 18 U/L (ref 15–41)
Alkaline Phosphatase: 54 U/L (ref 38–126)
Anion gap: 7 (ref 5–15)
BILIRUBIN TOTAL: 2 mg/dL — AB (ref 0.3–1.2)
BUN: 10 mg/dL (ref 6–20)
CO2: 26 mmol/L (ref 22–32)
Calcium: 8.9 mg/dL (ref 8.9–10.3)
Chloride: 101 mmol/L (ref 101–111)
Creatinine, Ser: 1.08 mg/dL (ref 0.61–1.24)
GFR calc Af Amer: >60 mL/min (ref >60)
GFR calc non Af Amer: >60 mL/min (ref >60)
GLUCOSE: 236 mg/dL — AB (ref 65–99)
POTASSIUM: 2.9 mmol/L — AB (ref 3.5–5.1)
Sodium: 134 mmol/L — ABNORMAL LOW (ref 135–145)
TOTAL PROTEIN: 6.3 g/dL — AB (ref 6.5–8.1)

## 2016-10-20 LAB — BASIC METABOLIC PANEL
ANION GAP: 8 (ref 5–15)
BUN: 12 mg/dL (ref 6–20)
CO2: 26 mmol/L (ref 22–32)
Calcium: 9.3 mg/dL (ref 8.9–10.3)
Chloride: 100 mmol/L — ABNORMAL LOW (ref 101–111)
Creatinine, Ser: 1.32 mg/dL — ABNORMAL HIGH (ref 0.61–1.24)
Glucose, Bld: 240 mg/dL — ABNORMAL HIGH (ref 65–99)
POTASSIUM: 3.7 mmol/L (ref 3.5–5.1)
SODIUM: 134 mmol/L — AB (ref 135–145)

## 2016-10-20 LAB — MAGNESIUM: Magnesium: 1.9 mg/dL (ref 1.7–2.4)

## 2016-10-20 LAB — LIPID PANEL
CHOL/HDL RATIO: 3.8 ratio
CHOLESTEROL: 145 mg/dL (ref 0–200)
HDL: 38 mg/dL — ABNORMAL LOW (ref >40)
LDL Cholesterol: 84 mg/dL (ref 0–99)
TRIGLYCERIDES: 113 mg/dL (ref <150)
VLDL: 23 mg/dL (ref 0–40)

## 2016-10-20 MED ORDER — CLOPIDOGREL BISULFATE 75 MG PO TABS: 75.0000 mg | ORAL_TABLET | Freq: Every day | ORAL | 0 refills | Status: AC

## 2016-10-20 MED ORDER — HYDROCHLOROTHIAZIDE 12.5 MG PO CAPS
12.5000 mg | ORAL_CAPSULE | Freq: Every day | ORAL | Status: DC
  Administered 2016-10-20: 12.5 mg via ORAL
  Filled 2016-10-20: qty 1

## 2016-10-20 MED ORDER — ACETAMINOPHEN 650 MG RE SUPP: 650.0000 mg | RECTAL | Status: DC | PRN

## 2016-10-20 MED ORDER — INSULIN ASPART 100 UNIT/ML ~~LOC~~ SOLN
0.0000 [IU] | Freq: Every day | SUBCUTANEOUS | Status: DC
  Administered 2016-10-20: 3 [IU] via SUBCUTANEOUS

## 2016-10-20 MED ORDER — LISINOPRIL-HYDROCHLOROTHIAZIDE 10-12.5 MG PO TABS: 1.0000 | ORAL_TABLET | Freq: Every day | ORAL | Status: DC

## 2016-10-20 MED ORDER — ATORVASTATIN CALCIUM 10 MG PO TABS
10.0000 mg | ORAL_TABLET | Freq: Every day | ORAL | Status: DC
  Administered 2016-10-20: 10 mg via ORAL
  Filled 2016-10-20: qty 1

## 2016-10-20 MED ORDER — POTASSIUM CHLORIDE CRYS ER 20 MEQ PO TBCR
40.0000 meq | EXTENDED_RELEASE_TABLET | ORAL | Status: AC
  Administered 2016-10-20 (×2): 40 meq via ORAL
  Filled 2016-10-20 (×2): qty 2

## 2016-10-20 MED ORDER — ASPIRIN 325 MG PO TABS
325.0000 mg | ORAL_TABLET | Freq: Every day | ORAL | Status: DC
  Administered 2016-10-20: 325 mg via ORAL
  Filled 2016-10-20: qty 1

## 2016-10-20 MED ORDER — ACETAMINOPHEN 160 MG/5ML PO SOLN: 650.0000 mg | ORAL | Status: DC | PRN

## 2016-10-20 MED ORDER — ASPIRIN 300 MG RE SUPP
300.0000 mg | Freq: Every day | RECTAL | Status: DC
  Filled 2016-10-20: qty 1

## 2016-10-20 MED ORDER — INSULIN ASPART 100 UNIT/ML ~~LOC~~ SOLN
0.0000 [IU] | Freq: Three times a day (TID) | SUBCUTANEOUS | Status: DC
  Administered 2016-10-20: 5 [IU] via SUBCUTANEOUS
  Administered 2016-10-20: 8 [IU] via SUBCUTANEOUS
  Administered 2016-10-20: 5 [IU] via SUBCUTANEOUS

## 2016-10-20 MED ORDER — CLOPIDOGREL BISULFATE 75 MG PO TABS: 75.0000 mg | ORAL_TABLET | Freq: Every day | ORAL | Status: DC

## 2016-10-20 MED ORDER — STROKE: EARLY STAGES OF RECOVERY BOOK
Freq: Once | Status: AC
  Administered 2016-10-20: 02:00:00

## 2016-10-20 MED ORDER — ENOXAPARIN SODIUM 40 MG/0.4ML ~~LOC~~ SOLN
40.0000 mg | Freq: Every day | SUBCUTANEOUS | Status: DC
  Administered 2016-10-20: 40 mg via SUBCUTANEOUS
  Filled 2016-10-20: qty 0.4

## 2016-10-20 MED ORDER — ACETAMINOPHEN 325 MG PO TABS: 650.0000 mg | ORAL_TABLET | ORAL | Status: DC | PRN

## 2016-10-20 MED ORDER — LISINOPRIL 10 MG PO TABS
10.0000 mg | ORAL_TABLET | Freq: Every day | ORAL | Status: DC
  Administered 2016-10-20: 10 mg via ORAL
  Filled 2016-10-20: qty 1

## 2016-10-20 MED ORDER — ATORVASTATIN CALCIUM 10 MG PO TABS: 10.0000 mg | ORAL_TABLET | Freq: Every day | ORAL | 0 refills | Status: AC

## 2016-10-20 MED ORDER — BUPROPION HCL ER (XL) 150 MG PO TB24
150.0000 mg | ORAL_TABLET | Freq: Every day | ORAL | Status: DC
  Administered 2016-10-20: 150 mg via ORAL
  Filled 2016-10-20: qty 1

## 2016-10-20 MED ORDER — TAMSULOSIN HCL 0.4 MG PO CAPS
0.4000 mg | ORAL_CAPSULE | Freq: Every day | ORAL | Status: DC
  Administered 2016-10-20: 0.4 mg via ORAL
  Filled 2016-10-20: qty 1

## 2016-10-20 NOTE — Evaluation (Signed)
Physical Therapy Evaluation Patient Details Name: Brian Daniel MRN: [id number] DOB: 1965-07-04 Today's Date: 10/20/2016   History of Present Illness   Brian Daniel is a 51 y.o. male with a past medical history significant for HTN and DM and remote TIA reportedly who presents with acute dizziness.  Clinical Impression  Pt is at or close to baseline functioning and should be safe at home.  He is experiencing no symptoms from the thalamic infarct when subjected to mod/maximal challenge to mobility and gait.  There are no further acute PT needs.  Will sign off at this time.     Follow Up Recommendations No PT follow up    Equipment Recommendations  None recommended by PT    Recommendations for Other Services       Precautions / Restrictions Precautions Precautions: None      Mobility  Bed Mobility Overal bed mobility: Independent                Transfers Overall transfer level: Independent                  Ambulation/Gait Ambulation/Gait assistance: Independent Ambulation Distance (Feet): 600 Feet Assistive device: None Gait Pattern/deviations: WFL(Within Functional Limits)   Gait velocity interpretation: >2.62 ft/sec, indicative of independent community ambulator    Stairs Stairs: Yes Stairs assistance: Independent Stair Management: No rails;Alternating pattern;Forwards Number of Stairs: 8 General stair comments: safe without rails  Wheelchair Mobility    Modified Rankin (Stroke Patients Only) Modified Rankin (Stroke Patients Only) Pre-Morbid Rankin Score: No symptoms Modified Rankin: No symptoms     Balance Overall balance assessment: Independent                               Standardized Balance Assessment Standardized Balance Assessment : Dynamic Gait Index   Dynamic Gait Index Level Surface: Normal Change in Gait Speed: Normal Gait with Horizontal Head Turns: Normal Gait with Vertical Head Turns: Normal Gait and Pivot  Turn: Normal Step Over Obstacle: Normal Step Around Obstacles: Normal Steps: Normal Total Score: 24       Pertinent Vitals/Pain Pain Assessment: No/denies pain    Home Living Family/patient expects to be discharged to:: Private residence Living Arrangements: Spouse/significant other;Children Available Help at Discharge: Family Type of Home: House       Home Layout: One level Home Equipment: None      Prior Function Level of Independence: Independent               Hand Dominance        Extremity/Trunk Assessment   Upper Extremity Assessment Upper Extremity Assessment: Overall WFL for tasks assessed    Lower Extremity Assessment Lower Extremity Assessment: Overall WFL for tasks assessed    Cervical / Trunk Assessment Cervical / Trunk Assessment: Normal  Communication   Communication: No difficulties  Cognition Arousal/Alertness: Awake/alert Behavior During Therapy: WFL for tasks assessed/performed Overall Cognitive Status: Within Functional Limits for tasks assessed                                        General Comments      Exercises     Assessment/Plan    PT Assessment Patent does not need any further PT services  PT Problem List         PT Treatment Interventions  PT Goals (Current goals can be found in the Care Plan section)  Acute Rehab PT Goals PT Goal Formulation: All assessment and education complete, DC therapy    Frequency     Barriers to discharge        Co-evaluation               AM-PAC PT "6 Clicks" Daily Activity  Outcome Measure Difficulty turning over in bed (including adjusting bedclothes, sheets and blankets)?: None Difficulty moving from lying on back to sitting on the side of the bed? : None Difficulty sitting down on and standing up from a chair with arms (e.g., wheelchair, bedside commode, etc,.)?: None Help needed moving to and from a bed to chair (including a wheelchair)?:  None Help needed walking in hospital room?: None Help needed climbing 3-5 steps with a railing? : None 6 Click Score: 24    End of Session   Activity Tolerance: Patient tolerated treatment well Patient left: in bed Nurse Communication: Mobility status PT Visit Diagnosis: Unsteadiness on feet (R26.81)    Time: 1650-1705 PT Time Calculation (min) (ACUTE ONLY): 15 min   Charges:   PT Evaluation $PT Eval Low Complexity: 1 Procedure     PT G Codes:   PT G-Codes **NOT FOR INPATIENT CLASS** Functional Assessment Tool Used: AM-PAC 6 Clicks Basic Mobility;Clinical judgement Functional Limitation: Mobility: Walking and moving around Mobility: Walking and Moving Around Current Status (Z6109): 0 percent impaired, limited or restricted Mobility: Walking and Moving Around Goal Status (U0454): 0 percent impaired, limited or restricted Mobility: Walking and Moving Around Discharge Status [id number]): 0 percent impaired, limited or restricted    10/20/2016  Belding Bing, PT [phone]  (pager)  Brian Daniel 10/20/2016, 5:11 PM

## 2016-10-20 NOTE — Progress Notes (Signed)
Pt. Off unit for MRI via bed

## 2016-10-20 NOTE — Progress Notes (Signed)
Patient 51  Year old [facility] with left side weakness and slurred speech has improved significantly. Alert and oriented x 4, pupils equal and reactive. Oriented to unit and cardiac monitor applied and verified with CC MT and 2 Nsg staff. Rhythm is Sinus.   No c/o at this time. RN will continue to monitor.

## 2016-10-20 NOTE — Progress Notes (Signed)
VASCULAR LAB PRELIMINARY  PRELIMINARY  PRELIMINARY  PRELIMINARY  Carotid duplex completed.    Preliminary report:  1-39% ICA plaquing.  Vertebral artery flow is antegrade.   Kyliegh Jester, RVT 10/20/2016, 10:34 AM

## 2016-10-20 NOTE — Progress Notes (Signed)
STROKE TEAM PROGRESS NOTE   HISTORY OF PRESENT ILLNESS (per record) Brian Daniel is a 51 y.o. male with history of HTN and DM and remote TIA who presented with acute onset blurred vision, slurred speech, dizziness, left-sided facial droop, and left-sided weakness on 10/19/2016.  Patient was not administered IV t-PA due to minimal residual deficits on arrival. He was admitted to General Neurology for further evaluation and treatment.  SUBJECTIVE (INTERVAL HISTORY) The patient is alert and awake and follows commands.  OBJECTIVE Temp:  [97.9 F (36.6 C)-98.4 F (36.9 C)] 98.2 F (36.8 C) (06/14 0930) Pulse Rate:  [75-93] 93 (06/14 0730) Cardiac Rhythm: Normal sinus rhythm (06/14 0730) Resp:  [12-23] 16 (06/14 0930) BP: (129-167)/(82-107) 134/98 (06/14 0930) SpO2:  [94 %-99 %] 96 % (06/14 0930) FiO2 (%):  [0 %] 0 % (06/14 0110) Weight:  [89.4 kg (197 lb)-89.6 kg (197 lb 8.5 oz)] 89.4 kg (197 lb) (06/14 0055)  CBC:  Recent Labs Lab 10/19/16 2215 10/19/16 2218 10/20/16 0415  WBC 7.5  --  7.7  NEUTROABS 4.8  --   --   HGB 16.5 16.0 15.1  HCT 45.6 47.0 43.1  MCV 87.4  --  87.6  PLT 183  --  180    Basic Metabolic Panel:  Recent Labs Lab 10/19/16 2215 10/19/16 2218 10/20/16 0415 10/20/16 0730  NA 134* 137 134*  --   K 3.4* 3.4* 2.9*  --   CL 101 97* 101  --   CO2 26  --  26  --   GLUCOSE 255* 249* 236*  --   BUN 9 11 10   --   CREATININE 1.19 1.10 1.08  --   CALCIUM 9.3  --  8.9  --   MG  --   --   --  1.9    Lipid Panel:    Component Value Date/Time   CHOL 145 10/20/2016 0415   CHOL 123 01/11/2012 1009   TRIG 113 10/20/2016 0415   TRIG 197 01/11/2012 1009   HDL 38 (L) 10/20/2016 0415   HDL 31 (L) 01/11/2012 1009   CHOLHDL 3.8 10/20/2016 0415   VLDL 23 10/20/2016 0415   VLDL 39 01/11/2012 1009   LDLCALC 84 10/20/2016 0415   LDLCALC 53 01/11/2012 1009   HgbA1c:  Lab Results  Component Value Date   HGBA1C 9.4 (H) 05/05/2016   Urine Drug Screen:     Component Value Date/Time   LABOPIA NONE DETECTED 10/19/2016 2248   COCAINSCRNUR NONE DETECTED 10/19/2016 2248   LABBENZ NONE DETECTED 10/19/2016 2248   AMPHETMU NONE DETECTED 10/19/2016 2248   THCU NONE DETECTED 10/19/2016 2248   LABBARB NONE DETECTED 10/19/2016 2248    Alcohol Level     Component Value Date/Time   ETH <5 10/19/2016 2215    IMAGING  Mr Brain Wo Contrast 10/20/2016 Mr Mra Head/brain Wo Cm IMPRESSION: MRI HEAD: Acute 1 cm RIGHT thalamus infarct. Old small LEFT cerebellar infarct and minimal chronic small vessel ischemic disease. MRA HEAD: Negative.   Ct Head Code Stroke W/o Cm 10/19/2016 IMPRESSION: 1. No acute intracranial process. Old small LEFT cerebellar infarct and, minimal chronic small vessel ischemic disease.  Carotid Doppler   10/20/2016 IMPRESSION: Bilateral: intimal wall thickening CCA. 1-39% ICA plaquing. Vertebral artery flow is antegrade.   10/20/2016 2D Echocardiogram - Left ventricle: The cavity size was normal. Wall thickness was normal. Systolic function was normal. The estimated ejection fraction was in the range of 55% to 60%. Wall motion was  normal; there were no regional wall motion abnormalities. Left ventricular diastolic function parameters were normal. - Atrial septum: No defect or patent foramen ovale was identified.  PHYSICAL EXAM  PHYSICAL EXAM Physical exam: Exam: Gen: NAD Eyes: anicteric sclerae, moist conjunctivae                    CV: no MRG, no carotid bruits, no peripheral edema Mental Status: Alert, follows commands, good historian  Neuro: Detailed Neurologic Exam  Speech:    No aphasia, no dysarthria  Cranial Nerves:    The pupils are equal, round, and reactive to light.. Attempted, Fundi not visualized.  EOMI. No gaze preference. Visual fields full. Face symmetric, Tongue midline. Hearing intact to voice. Shoulder shrug intact  Motor Observation:    no involuntary movements noted. Tone appears normal.      Strength:    5/5     Sensation:  Intact to LT  Plantars downgoing.    ASSESSMENT/PLAN Mr. Brian Daniel is a 51 y.o. male with history of HTN, DM, TIA, presenting with acute onset blurred vision, slurred speech, dizziness, left-sided facial droop, and left-sided weakness . He did not receive IV t-PA due to minimal residual deficits on arrival.   Stroke: 1 cm right thalamic infarct in the setting of old small left cerebellar infarct and mild chronic small vessel ischemic disease. This location is usually due to small vessel disease however given the size of this infarct may also be embolic and I recommend embolic workup.  Resultant  No deficits  CT head: Old small left cerebellar infarct and, mild chronic small vessel ischemic disease.  MRI head: Acute 1 cm RIGHT thalamus infarct. Old small LEFT cerebellar infarct and minimal chronic small vessel ischemic disease.  MRA head: negative  Carotid Doppler B ICA 1-39% stenosis, VAs antegrade   2D Echo: EF 55-60%. No source of embolus  Outpatient TEE and consideration for loop  LDL 84  HgbA1c 9.4  Lovenox 40 mg sq daily for VTE prophylaxis  Diet heart healthy/carb modified Room service appropriate? Yes; Fluid consistency: Thin  aspirin 81 mg daily prior to admission, now on aspirin 325 mg daily. Please discharge on Plavix daily.  Patient counseled to be compliant with his antithrombotic medications  Ongoing aggressive stroke risk factor management  Therapy recommendations: none  Disposition: home  Hypertension  Stable  Permissive hypertension (OK if < 220/120) but gradually normalize in 5-7 days  Long-term BP goal normotensive  Hyperlipidemia  Home meds: none  LDL 84, goal < 70  Atorvastatin 10mg  PO daily added  Continue statin at discharge  Diabetes type II  HgbA1c 9.4, goal < 7.0  Uncontrolled  Other Stroke Risk Factors  ETOH use, advised to drink no more than 2 drink(s) a day  Overweight, Body mass  index is 29.09 kg/m., recommend weight loss, diet and exercise as appropriate  Family hx of stroke   Pt reports hx of remote TIAs  Other Active Problems  Elevated bilirubin, etiology unclear  Hypokalemia (2.9)  BPH on flomax  Erectile dysfunction on cialis  Depression, on wellbutrin   Hospital day # 0  Stroke will sign off. Outpatient TEE and loop requested, referral to Dr. Roda Shutters follow up.  Personally examined patient and images, and have participated in and made any corrections needed to history, physical, neuro exam,assessment and plan as stated above.  I have personally obtained the history, evaluated lab date, reviewed imaging studies and agree with radiology interpretations.    Desma Maxim  Lucia GaskinsAhern, MD Stroke Neurology    To contact Stroke Continuity provider, please refer to [email or web address]. After hours, contact General Neurology

## 2016-10-20 NOTE — Discharge Instructions (Signed)

## 2016-10-20 NOTE — Progress Notes (Signed)
Patient back to unit from MRI.

## 2016-10-20 NOTE — Progress Notes (Signed)
  Echocardiogram 2D Echocardiogram has been performed.  Brian Daniel 10/20/2016, 12:16 PM

## 2016-10-20 NOTE — Progress Notes (Signed)
Inpatient Diabetes Program Recommendations  AACE/ADA: New Consensus Statement on Inpatient Glycemic Control (2015)  Target Ranges:  Prepandial:   less than 140 mg/dL      Peak postprandial:   less than 180 mg/dL (1-2 hours)      Critically ill patients:  140 - 180 mg/dL   Results for Brian AcostaSHUTT, Elsie (MRN [id number]) as of 10/20/2016 12:36  Ref. Range 10/19/2016 22:12 10/19/2016 22:58 10/20/2016 01:41 10/20/2016 06:11 10/20/2016 11:27  Glucose-Capillary Latest Ref Range: 65 - 99 mg/dL 846243 (H) 962265 (H) 952274 (H) 219 (H) 257 (H)   Review of Glycemic Control  Diabetes history: DM2 Outpatient Diabetes medications: Victoza 1.8 mg daily, Metformin 500 BID, Janumet 50-500 mg BID Current orders for Inpatient glycemic control: Novolog 0-15 units TID with meals, Novolog 0-5 units QHS  Inpatient Diabetes Program Recommendations:  Insulin - Basal: While inpatient, please consider ordering Lantus 10 units daily. Insulin - Meal Coverage: While inpatient, please consider ordering Novolog 4 units TID with meals for meal coverage if patient eats at least 50% of meals. A1C: In process.  Thanks, Orlando PennerMarie Eaton Folmar, RN, MSN, CDE Diabetes Coordinator Inpatient Diabetes Program [phone] (Team Pager from 8am to 5pm)

## 2016-10-20 NOTE — Discharge Summary (Signed)
Physician Discharge Summary  Brian Daniel ZOX:[id number] DOB: 11-28-1965  PCP: Coralee Rud, PA-C  Admit date: 10/19/2016 Discharge date: 10/20/2016  Recommendations for Outpatient Follow-up:  1. Roxanne Mins, PA-C/PCP in 4 days with repeat labs (CMP). Please follow A1c results that were sent from the hospital. Please address poorly controlled DM 2. 2. Dr. Naomie Dean, Neurology: MDs office will arrange further outpatient stroke evaluation (TEE & loop recorder) and M.D. follow-up.  Home Health: None Equipment/Devices: None    Discharge Condition: Improved and stable  CODE STATUS: Full  Diet recommendation: Heart healthy & diabetic diet.  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 He dialed a friend who noted that he had slurred speech and called EMS. As per his report, EMS noted slurred speech, left-sided facial droop and weakness. He was admitted for further evaluation and management.  Assessment and plan:  1. Acute right brain stroke: CT head: No acute intracranial process. Old small left cerebellar infarct and minimal chronic small vessel ischemic disease. MRI head: Acute 1 cm right thalamic infarct. MRA head: Negative. Carotid Doppler: Intimal wall thickening CCA. 1-39 percent ICA plaquing. Vertebral artery flow  is antegrade. 2-D echo: LVEF 55-60 percent. Hemoglobin A1c: Pending. LDL 84. Resultant blurred vision, slurred speech, left hemiparesis-all resolved. Was on aspirin 81 MG daily PTA. No TPA given due to rapid resolution of symptoms. Neurology consultation and stroke M.D. follow-up appreciated. PT does not recommend any follow-up. Discussed with Stroke MD who recommended switching antiplatelets from aspirin to Plavix 75 MG daily beginning tomorrow, start atorvastatin 10 MG daily, her office will arrange outpatient TEE & loop recorder, MD follow-up and cleared patient for discharge home. 2. Poorly controlled DM 2: Last A1c on 05/05/16:9.4. Continue current home oral hypoglycemics. Counseled patient extensively that his diabetes needs much better control. He states that his home CBG checks usually run between 320-380. Advised him to follow-up with his PCP in the next couple of days for further evaluation and management and may consider initiation of insulin. He verbalized understanding. 3. Essential hypertension: Mildly uncontrolled. Continue home antihypertensives. 4. Hyperlipidemia: LDL 84. Started atorvastatin 10 MG daily. 5. Severe hypokalemia: Replaced. Magnesium 1.9. 6. Elevated creatinine: Initial creatinine this morning was 0.18. Repeat creatinine later this afternoon was 1.32. Clinically euvolemic. Unclear why his creatinine went up. He was counseled to hydrate himself adequately and follow-up with his PCP early next week for repeat BMP. He verbalized understanding. 7. Hyponatremia: Likely related to hyperglycemia and HCTZ. Stable. 8. Isolated hyperbilirubinemia: No GI symptoms or icterus. Unclear etiology. May consider outpatient follow-up and evaluation as deemed necessary.   Consultations:  Neurology  Procedures:  2-D echo  Carotid Dopplers   Discharge Instructions  Discharge Instructions    Activity as tolerated - No restrictions    Complete by:  As directed    Ambulatory referral  to Neurology  Complete by:  As directed    Needs follow up in 8-10 weeks   Call MD for:    Complete by:  As directed    Strokelike symptoms.   Diet - low sodium heart healthy    Complete by:  As directed    Diet Carb Modified    Complete by:  As directed        Medication List    TAKE these medications   atorvastatin 10 MG tablet Commonly known as:  LIPITOR Take 1 tablet (10 mg total) by mouth daily at 6 PM.   buPROPion 150 MG 24 hr tablet Commonly known as:  WELLBUTRIN XL Take 150 mg by mouth daily.   clopidogrel 75 MG tablet Commonly known as:  PLAVIX Take 1 tablet (75 mg total) by mouth daily. Start taking on:  10/21/2016   lisinopril-hydrochlorothiazide 10-12.5 MG tablet Commonly known as:  PRINZIDE,ZESTORETIC Take 1 tablet by mouth daily.   multivitamin with minerals tablet Take 1 tablet by mouth daily.   sitaGLIPtin-metformin 50-500 MG tablet Commonly known as:  JANUMET Take 1 tablet by mouth 2 (two) times daily with a meal.   tadalafil 5 MG tablet Commonly known as:  CIALIS Take 5 mg by mouth daily as needed for erectile dysfunction.   tamsulosin 0.4 MG Caps capsule Commonly known as:  FLOMAX Take 0.4 mg by mouth daily.   VICTOZA 18 MG/3ML Sopn Generic drug:  liraglutide Inject 1.8 mg into the skin daily.      Follow-up Information    Tayari, Yankee, PA-C. Schedule an appointment as soon as possible for a visit in 4 day(s).   Specialty:  Cardiology Why:  To be seen with repeat labs (BMP). Please follow final hemoglobin A1c results that were sent from the hospital. Follow-up regarding diabetes management. Contact information: [facility]  [address] [place] [address] [phone]        Anson Fret, MD. Schedule an appointment as soon as possible for a visit.   Specialty:  Neurology Why:  MD's office will call you to arrange further out patient stroke evaluation (TEE & loop recorder) and MD follow-up. Contact  information: [address] [place] [address] [phone]          No Known Allergies    Procedures/Studies: Mr Brain Wo Contrast  Result Date: 10/20/2016 CLINICAL DATA:  Acute onset of dizziness and blurry vision while driving, LEFT-sided weakness and slurred speech. Evaluate transient ischemic attack. History of hypertension and diabetes. EXAM: MRI HEAD WITHOUT CONTRAST MRA HEAD WITHOUT CONTRAST TECHNIQUE: Multiplanar, multiecho pulse sequences of the brain and surrounding structures were obtained without intravenous contrast. Angiographic images of the head were obtained using MRA technique without contrast. COMPARISON:  CT HEAD October 19, 2016 and MRI/MRA of the head January 11, 2012 FINDINGS: MRI HEAD FINDINGS BRAIN: Fall wide 10 mm reduced diffusion RIGHT medial thalamus with low ADC values. No susceptibility artifact to suggest hemorrhage. The ventricles and sulci are normal for patient's age. Old small LEFT cerebellar infarct. A few scattered punctate supratentorial white matter FLAIR T2 hyperintensities. No suspicious parenchymal signal, masses or mass effect. No abnormal extra-axial fluid collections. VASCULAR: Normal major intracranial vascular flow voids present at skull base. SKULL AND UPPER CERVICAL SPINE: No abnormal sellar expansion. No suspicious calvarial bone marrow signal. Craniocervical junction maintained. SINUSES/ORBITS: Lobulated RIGHT maxillary sinus mucosal thickening. Mastoid air cells are well aerated. The included ocular globes and orbital contents are non-suspicious. OTHER: None.  MRA HEAD FINDINGS ANTERIOR CIRCULATION: Normal flow related enhancement of the included cervical, petrous, cavernous and supraclinoid internal carotid arteries. Patent anterior communicating artery. Normal flow related enhancement of the anterior and middle cerebral arteries, including distal segments. No large vessel occlusion, high-grade stenosis, abnormal luminal irregularity,  aneurysm. POSTERIOR CIRCULATION: RIGHT vertebral artery is dominant. Basilar artery is patent, with normal flow related enhancement of the main branch vessels. Normal flow related enhancement of the posterior cerebral arteries. Bilateral posterior communicating arteries present. No large vessel occlusion, high-grade stenosis, abnormal luminal irregularity, aneurysm. ANATOMIC VARIANTS: None. Source images and MIP images were reviewed. IMPRESSION: MRI HEAD: Acute 1 cm RIGHT thalamus infarct. Old small LEFT cerebellar infarct and minimal chronic small vessel ischemic disease. MRA HEAD: Negative. Electronically Signed   By: Awilda Metro M.D.   On: 10/20/2016 05:43   Mr Maxine Glenn Head/brain ZO Cm  Result Date: 10/20/2016 CLINICAL DATA:  Acute onset of dizziness and blurry vision while driving, LEFT-sided weakness and slurred speech. Evaluate transient ischemic attack. History of hypertension and diabetes. EXAM: MRI HEAD WITHOUT CONTRAST MRA HEAD WITHOUT CONTRAST TECHNIQUE: Multiplanar, multiecho pulse sequences of the brain and surrounding structures were obtained without intravenous contrast. Angiographic images of the head were obtained using MRA technique without contrast. COMPARISON:  CT HEAD October 19, 2016 and MRI/MRA of the head January 11, 2012 FINDINGS: MRI HEAD FINDINGS BRAIN: Fall wide 10 mm reduced diffusion RIGHT medial thalamus with low ADC values. No susceptibility artifact to suggest hemorrhage. The ventricles and sulci are normal for patient's age. Old small LEFT cerebellar infarct. A few scattered punctate supratentorial white matter FLAIR T2 hyperintensities. No suspicious parenchymal signal, masses or mass effect. No abnormal extra-axial fluid collections. VASCULAR: Normal major intracranial vascular flow voids present at skull base. SKULL AND UPPER CERVICAL SPINE: No abnormal sellar expansion. No suspicious calvarial bone marrow signal. Craniocervical junction maintained. SINUSES/ORBITS: Lobulated  RIGHT maxillary sinus mucosal thickening. Mastoid air cells are well aerated. The included ocular globes and orbital contents are non-suspicious. OTHER: None. MRA HEAD FINDINGS ANTERIOR CIRCULATION: Normal flow related enhancement of the included cervical, petrous, cavernous and supraclinoid internal carotid arteries. Patent anterior communicating artery. Normal flow related enhancement of the anterior and middle cerebral arteries, including distal segments. No large vessel occlusion, high-grade stenosis, abnormal luminal irregularity, aneurysm. POSTERIOR CIRCULATION: RIGHT vertebral artery is dominant. Basilar artery is patent, with normal flow related enhancement of the main branch vessels. Normal flow related enhancement of the posterior cerebral arteries. Bilateral posterior communicating arteries present. No large vessel occlusion, high-grade stenosis, abnormal luminal irregularity, aneurysm. ANATOMIC VARIANTS: None. Source images and MIP images were reviewed. IMPRESSION: MRI HEAD: Acute 1 cm RIGHT thalamus infarct. Old small LEFT cerebellar infarct and minimal chronic small vessel ischemic disease. MRA HEAD: Negative. Electronically Signed   By: Awilda Metro M.D.   On: 10/20/2016 05:43   Ct Head Code Stroke W/o Cm  Result Date: 10/19/2016 CLINICAL DATA:  Code stroke. Confusion while driving. LEFT-sided weakness. History of hypertension and diabetes. Assess stroke. EXAM: CT HEAD WITHOUT CONTRAST TECHNIQUE: Contiguous axial images were obtained from the base of the skull through the vertex without intravenous contrast. COMPARISON:  MRI head January 21, 2012 FINDINGS: BRAIN: No intraparenchymal hemorrhage, mass effect nor midline shift. The ventricles and sulci are normal. No acute large vascular territory infarcts. A few scattered subcentimeter supratentorial white matter hypodensities compatible with early chronic small vessel ischemic disease. Old small LEFT cerebellar infarct. No abnormal  extra-axial fluid collections. Basal cisterns are patent. VASCULAR:  Unremarkable. SKULL/SOFT TISSUES: No skull fracture. No significant soft tissue swelling. ORBITS/SINUSES: The included ocular globes and orbital contents are normal.Lobulated RIGHT max sinus mucosal thickening. Probable follow-up RIGHT middle turbinate . Mastoid air cells are well aerated. OTHER: None. ASPECTS [facility]([place] Stroke Program Early CT Score) - Ganglionic level infarction (caudate, lentiform nuclei, internal capsule, insula, M1-M3 cortex): 7 - Supraganglionic infarction (M4-M6 cortex): 3 Total score (0-10 with 10 being normal): 10 IMPRESSION: 1. No acute intracranial process. Old small LEFT cerebellar infarct and, minimal chronic small vessel ischemic disease. 2. ASPECTS is 10. Critical Value/emergent results were called by telephone at the time of interpretation on 10/19/2016 at 10:28 pm to Dr. Roseanne RenoStewart, Neurology, who verbally acknowledged these results. Electronically Signed   By: Awilda Metroourtnay  Bloomer M.D.   On: 10/19/2016 22:29      Subjective: No further blurred vision, slurred speech, left sided weakness. No complaints reported. Never had chest pain, dyspnea or palpitations.  Discharge Exam:  Vitals:   10/20/16 0730 10/20/16 0930 10/20/16 1130 10/20/16 1330  BP: (!) 129/100 (!) 134/98 (!) 144/86 120/81  Pulse: 93  96 87  Resp:  16 18 16   Temp:  98.2 F (36.8 C) 98.5 F (36.9 C) 98.6 F (37 C)  TempSrc: Oral Oral Axillary Axillary  SpO2: 94% 96% 96% 99%  Weight:      Height:        General: Pleasant young male, moderately built and nourished, sitting up comfortably in chair this morning. Cardiovascular: S1 & S2 heard, RRR, S1/S2 +. No murmurs, rubs, gallops or clicks. No JVD or pedal edema. Telemetry: Sinus rhythm. Respiratory: Clear to auscultation without wheezing, rhonchi or crackles. No increased work of breathing. Abdominal:  Non distended, non tender & soft. No organomegaly or masses appreciated. Normal bowel  sounds heard. CNS: Alert and oriented. No focal deficits. No pronator drift. Extremities: no edema, no cyanosis    The results of significant diagnostics from this hospitalization (including imaging, microbiology, ancillary and laboratory) are listed below for reference.      Labs: CBC:  Recent Labs Lab 10/19/16 2215 10/19/16 2218 10/20/16 0415  WBC 7.5  --  7.7  NEUTROABS 4.8  --   --   HGB 16.5 16.0 15.1  HCT 45.6 47.0 43.1  MCV 87.4  --  87.6  PLT 183  --  180   Basic Metabolic Panel:  Recent Labs Lab 10/19/16 2215 10/19/16 2218 10/20/16 0415 10/20/16 0730 10/20/16 1649  NA 134* 137 134*  --  134*  K 3.4* 3.4* 2.9*  --  3.7  CL 101 97* 101  --  100*  CO2 26  --  26  --  26  GLUCOSE 255* 249* 236*  --  240*  BUN 9 11 10   --  12  CREATININE 1.19 1.10 1.08  --  1.32*  CALCIUM 9.3  --  8.9  --  9.3  MG  --   --   --  1.9  --    Liver Function Tests:  Recent Labs Lab 10/19/16 2215 10/20/16 0415  AST 22 18  ALT 30 27  ALKPHOS 59 54  BILITOT 2.3* 2.0*  PROT 6.9 6.3*  ALBUMIN 4.3 3.7   CBG:  Recent Labs Lab 10/19/16 2258 10/20/16 0141 10/20/16 0611 10/20/16 1127 10/20/16 1622  GLUCAP 265* 274* 219* 257* 225*   Lipid Profile  Recent Labs  10/20/16 0415  CHOL 145  HDL 38*  LDLCALC 84  TRIG 696113  CHOLHDL 3.8  Urinalysis    Component Value Date/Time   COLORURINE YELLOW 10/19/2016 2248   APPEARANCEUR CLEAR 10/19/2016 2248   APPEARANCEUR Clear 01/11/2012 0912   LABSPEC 1.010 10/19/2016 2248   LABSPEC 1.039 01/11/2012 0912   PHURINE 6.0 10/19/2016 2248   GLUCOSEU >=500 (A) 10/19/2016 2248   GLUCOSEU >=500 01/11/2012 0912   HGBUR NEGATIVE 10/19/2016 2248   BILIRUBINUR NEGATIVE 10/19/2016 2248   BILIRUBINUR Negative 01/11/2012 0912   KETONESUR NEGATIVE 10/19/2016 2248   PROTEINUR NEGATIVE 10/19/2016 2248   NITRITE NEGATIVE 10/19/2016 2248   LEUKOCYTESUR NEGATIVE 10/19/2016 2248   LEUKOCYTESUR Negative 01/11/2012 0912      Time  coordinating discharge: Over 30 minutes  SIGNED:  Marcellus Scott, MD, FACP, FHM. [facility] Pager [phone]  If 7PM-7AM, please contact night-coverage [email or web address] Password [facility] 10/20/2016, 6:35 PM

## 2016-10-20 NOTE — Progress Notes (Signed)
Pt discharged home. Discharge instructions were reviewed with the pt. Pt verbalized understanding.  

## 2016-10-21 LAB — HEMOGLOBIN A1C
HEMOGLOBIN A1C: 10.1 % — AB (ref 4.8–5.6)
Mean Plasma Glucose: 243 mg/dL

## 2016-10-21 NOTE — Care Management Note (Signed)
Case Management Note  Patient Details  Name: Brian Daniel MRN: [id number] Date of Birth: 1965/11/12  Subjective/Objective:                    Action/Plan: 10/21/2016 at 0918: Pt discharged home late yesterday with self care. No f/u and no DME needs per PT. Pt had transportation home.   Expected Discharge Date:  10/20/16               Expected Discharge Plan:  Home/Self Care  In-House Referral:     Discharge planning Services     Post Acute Care Choice:    Choice offered to:     DME Arranged:    DME Agency:     HH Arranged:    HH Agency:     Status of Service:  Completed, signed off  If discussed at [employer] Length of Stay Meetings, dates discussed:    Additional Comments:  Kermit BaloKelli F Cai Anfinson, RN 10/21/2016, 9:17 AM

## 2016-10-24 ENCOUNTER — Encounter: Payer: Self-pay | Admitting: *Deleted

## 2016-10-24 ENCOUNTER — Other Ambulatory Visit: Payer: Self-pay | Admitting: Physician Assistant

## 2016-10-24 ENCOUNTER — Telehealth: Payer: Self-pay | Admitting: *Deleted

## 2016-10-24 NOTE — Telephone Encounter (Signed)
-----   Message from Wiliam KeJennifer A Smith, RN sent at 10/24/2016  7:44 AM EDT -----   ----- Message ----- From: Janetta Horahompson, Kathryn R, PA-C Sent: 10/21/2016  10:58 AM To: Wiliam KeJennifer A Smith, RN  outpatient TEE set up for 6/20 at 9am for stroke. Dr. Rennis GoldenHilty will be MD performing it. Amber will take care of Loop and Boneta LucksJenny, can you help call the patient with instructions and put in outpatient orders ? Thanks!!

## 2016-10-24 NOTE — Telephone Encounter (Signed)
I spoke with patient and reviewed instructions for TEE with patient.    Pt advised:  Nothing to eat or drink after midnight the night before the TEE. Hold Janumet and Victoza the morning of the TEE. Will not be able to drive home, ask that someone be with him at home after the TEE. Call our office if questions, [phone].

## 2016-10-25 ENCOUNTER — Telehealth: Payer: Self-pay

## 2016-10-25 ENCOUNTER — Other Ambulatory Visit: Payer: Self-pay

## 2016-10-25 NOTE — Patient Outreach (Signed)
[facility] Care Management  10/25/2016  Brian AcostaMichael Lawrance 30-Nov-1965 [id number]       EMMI- STROKE RED ON EMMI ALERT Day # 3 Date: 10/24/16 Red Alert Reason: "Feeling worse overall? Yes"    Outreach attempt # 1 to patient. Spoke with patient. States he is doing fairly well. Reviewed and addressed red alert with patient. He voices that phone "cut out" on him during the time he was answering automated questions and machine recorded inaccurate response. He denies feeling worse overall. However, he complain of some nausea that started over the weekend when he began his new medicines. He had f/u appt with PCP on yesterday and did advised him of this. MD did not prescribe anything. Patient is scheduled or TEE on tomorrow. MD advised patient that it could possibly be due to his new meds. RN CM encouraged patient to make sure he is not taking meds on any empty stomach. He confirmed that appetite is not too good but he is eating small amounts of food prior to taking meds. He denies any vomiting. Patient advised to seek medical attention for any worsening and/or unresolved symptoms. Also, reviewed with patient s/s of stroke and when to seek medical care. He voiced understanding. No further RN CM needs or concerns at this time. Advised patient that they would continue to get automated EMMI-Stroke post discharge calls to assess how they are doing following recent hospitalization and will receive a call from a nurse if any of their responses were abnormal. Patient voiced understanding and was appreciative of f/u call.      Plan: RN CM will [facility] of case status.   Antionette Fairyoshanda Drexler Maland, RN,BSN,CCM [facility] Care Management Telephonic Care Management Coordinator Direct Phone: [phone] Toll Free: [phone] Fax: [phone]

## 2016-10-25 NOTE — Telephone Encounter (Signed)
FAXED NOTES TO [facility] 

## 2016-10-26 ENCOUNTER — Ambulatory Visit (HOSPITAL_COMMUNITY): Admit: 2016-10-26 | Payer: BLUE CROSS/BLUE SHIELD | Admitting: Internal Medicine

## 2016-10-26 ENCOUNTER — Ambulatory Visit (HOSPITAL_COMMUNITY)
Admission: RE | Admit: 2016-10-26 | Discharge: 2016-10-26 | Disposition: A | Discharge status: Home / Self Care | Payer: BLUE CROSS/BLUE SHIELD | Source: Ambulatory Visit | Attending: Internal Medicine | Admitting: Internal Medicine

## 2016-10-26 ENCOUNTER — Encounter (HOSPITAL_COMMUNITY)
Admission: RE | Disposition: A | Discharge status: Home / Self Care | Payer: Self-pay | Source: Ambulatory Visit | Attending: Internal Medicine

## 2016-10-26 ENCOUNTER — Encounter (HOSPITAL_COMMUNITY): Payer: Self-pay

## 2016-10-26 ENCOUNTER — Ambulatory Visit (HOSPITAL_BASED_OUTPATIENT_CLINIC_OR_DEPARTMENT_OTHER)
Admission: RE | Admit: 2016-10-26 | Discharge: 2016-10-26 | Disposition: A | Discharge status: Home / Self Care | Payer: BLUE CROSS/BLUE SHIELD | Source: Ambulatory Visit | Attending: Physician Assistant | Admitting: Physician Assistant

## 2016-10-26 DIAGNOSIS — F329 Major depressive disorder, single episode, unspecified: Secondary | ICD-10-CM | POA: Diagnosis not present

## 2016-10-26 DIAGNOSIS — Z8673 Personal history of transient ischemic attack (TIA), and cerebral infarction without residual deficits: Secondary | ICD-10-CM | POA: Insufficient documentation

## 2016-10-26 DIAGNOSIS — E119 Type 2 diabetes mellitus without complications: Secondary | ICD-10-CM | POA: Insufficient documentation

## 2016-10-26 DIAGNOSIS — I638 Other cerebral infarction: Secondary | ICD-10-CM

## 2016-10-26 DIAGNOSIS — Q211 Atrial septal defect: Secondary | ICD-10-CM | POA: Diagnosis not present

## 2016-10-26 DIAGNOSIS — I10 Essential (primary) hypertension: Secondary | ICD-10-CM | POA: Insufficient documentation

## 2016-10-26 DIAGNOSIS — Z79899 Other long term (current) drug therapy: Secondary | ICD-10-CM | POA: Insufficient documentation

## 2016-10-26 DIAGNOSIS — G4730 Sleep apnea, unspecified: Secondary | ICD-10-CM | POA: Insufficient documentation

## 2016-10-26 DIAGNOSIS — I639 Cerebral infarction, unspecified: Secondary | ICD-10-CM | POA: Diagnosis not present

## 2016-10-26 DIAGNOSIS — Z9049 Acquired absence of other specified parts of digestive tract: Secondary | ICD-10-CM | POA: Diagnosis not present

## 2016-10-26 HISTORY — PX: LOOP RECORDER INSERTION: EP1214

## 2016-10-26 HISTORY — DX: Major depressive disorder, single episode, unspecified: F32.9

## 2016-10-26 HISTORY — DX: Depression, unspecified: F32.A

## 2016-10-26 HISTORY — DX: Sleep apnea, unspecified: G47.30

## 2016-10-26 HISTORY — PX: TEE WITHOUT CARDIOVERSION: SHX5443

## 2016-10-26 SURGERY — LOOP RECORDER INSERTION

## 2016-10-26 SURGERY — ECHOCARDIOGRAM, TRANSESOPHAGEAL
Anesthesia: Moderate Sedation

## 2016-10-26 MED ORDER — BUTAMBEN-TETRACAINE-BENZOCAINE 2-2-14 % EX AERO
INHALATION_SPRAY | CUTANEOUS | Status: DC | PRN
  Administered 2016-10-26: 2 via TOPICAL

## 2016-10-26 MED ORDER — SODIUM CHLORIDE 0.9 % IV SOLN: INTRAVENOUS | Status: DC

## 2016-10-26 MED ORDER — FENTANYL CITRATE (PF) 100 MCG/2ML IJ SOLN
INTRAMUSCULAR | Status: DC | PRN
  Administered 2016-10-26 (×2): 25 ug via INTRAVENOUS

## 2016-10-26 MED ORDER — FENTANYL CITRATE (PF) 100 MCG/2ML IJ SOLN
INTRAMUSCULAR | Status: AC
  Filled 2016-10-26: qty 2

## 2016-10-26 MED ORDER — MIDAZOLAM HCL 10 MG/2ML IJ SOLN
INTRAMUSCULAR | Status: DC | PRN
  Administered 2016-10-26 (×2): 2 mg via INTRAVENOUS

## 2016-10-26 MED ORDER — MIDAZOLAM HCL 5 MG/ML IJ SOLN
INTRAMUSCULAR | Status: AC
  Filled 2016-10-26: qty 2

## 2016-10-26 MED ORDER — LIDOCAINE-EPINEPHRINE 1 %-1:100000 IJ SOLN
INTRAMUSCULAR | Status: AC
Start: 2016-10-26 — End: 2016-10-26
  Filled 2016-10-26: qty 1

## 2016-10-26 MED ORDER — LIDOCAINE VISCOUS 2 % MT SOLN
OROMUCOSAL | Status: AC
  Filled 2016-10-26: qty 15

## 2016-10-26 MED ORDER — LIDOCAINE HCL (PF) 1 % IJ SOLN
INTRAMUSCULAR | Status: DC | PRN
  Administered 2016-10-26: 20 mL via SUBCUTANEOUS

## 2016-10-26 MED ORDER — LIDOCAINE VISCOUS 2 % MT SOLN
OROMUCOSAL | Status: DC | PRN
  Administered 2016-10-26: 10 mL via OROMUCOSAL

## 2016-10-26 SURGICAL SUPPLY — 2 items
LOOP REVEAL LINQSYS (Prosthesis & Implant Heart) ×3 IMPLANT
PACK LOOP INSERTION (CUSTOM PROCEDURE TRAY) ×3 IMPLANT

## 2016-10-26 NOTE — Discharge Instructions (Signed)

## 2016-10-26 NOTE — Consult Note (Addendum)
ELECTROPHYSIOLOGY CONSULT NOTE  Patient ID: Brian Daniel MRN: [id number], DOB/AGE: March 06, 1966   Admit date: 10/26/2016 Date of Consult: 10/26/2016  Primary Physician: Coralee Daniel, Criag R, PA-Daniel Primary Cardiologist: new [facility] Reason for Consultation: Cryptogenic stroke; recommendations regarding Implantable Loop Recorder  History of Present Illness Brian Daniel is a 51 y.o. male whom EP has been asked to see by Brian. Pearlean Daniel for evaluation of ILR in the setting of cryptogenic stroke and prior TIA by Brian Daniel.  He was admitted earlier this month with acute onset blurred vision, slurred speech, dizziness, and left sided facial droop.  Imaging demonstrated right thalamic infarct in the setting of old small left cerebellar infarct felt to be embolic 2/2 unknown source.  He has undergone workup for stroke including echocardiogram and carotid dopplers.  The patient has been monitored on telemetry which has demonstrated sinus rhythm with no arrhythmias.  Stroke work-up is to be completed with a TEE.   Echocardiogram this admission demonstrated EF 55-60%, no RWMA, LA 32.  Lab work is reviewed.  Prior to admission, the patient denies chest pain, shortness of breath, dizziness, palpitations, or syncope.    EP has been asked to evaluate for placement of an implantable loop recorder to monitor for atrial fibrillation.   Past Medical History:  Diagnosis Date  . Depression   . Diabetes mellitus without complication (HCC)   . Hypertension   . Sleep apnea    uses CPAP  . TIA (transient ischemic attack)      Surgical History:  Past Surgical History:  Procedure Laterality Date  . CHOLECYSTECTOMY    . JOINT REPLACEMENT    . LOOP RECORDER INSERTION N/A 10/26/2016   Procedure: Loop Recorder Insertion;  Surgeon: Brian Daniel, Brian W, MD;  [facility];  Service: Cardiovascular;  Laterality: N/A;  . TEE WITHOUT CARDIOVERSION N/A 10/26/2016   Procedure: TRANSESOPHAGEAL ECHOCARDIOGRAM  (TEE);  Surgeon: Brian Daniel, Brian C, MD;  [facility];  Service: Cardiovascular;  Laterality: N/A;     No prescriptions prior to admission.    Inpatient Medications:   Allergies: No Known Allergies  Social History   Social History  . Marital status: Married    Spouse name: N/A  . Number of children: N/A  . Years of education: N/A   Occupational History  . Not on file.   Social History Main Topics  . Smoking status: Never Smoker  . Smokeless tobacco: Never Used  . Alcohol use Yes     Comment: occ  . Drug use: No  . Sexual activity: Not on file   Other Topics Concern  . Not on file   Social History Narrative  . No narrative on file     Family History  Problem Relation Age of Onset  . Stroke Mother   . COPD Mother       Review of Systems: All other systems reviewed and are otherwise negative except as noted above.  Physical Exam: Vitals:   10/26/16 1130 10/26/16 1133 10/26/16 1140 10/26/16 1150  BP: 113/74 113/74 115/78 116/77  Pulse: 100 100 (!) 101 (!) 102  Resp: 15 15 17 17   Temp:      TempSrc:      SpO2: 97% 97% 96% 98%  Weight:      Height:        GEN- The patient is well appearing, alert and oriented x 3 today.   Head- normocephalic, atraumatic Eyes-  Sclera clear, conjunctiva pink Ears- hearing intact Oropharynx-  clear Neck- supple Lungs- Clear to ausculation bilaterally, normal work of breathing Heart- Regular rate and rhythm, no murmurs, rubs or gallops  GI- soft, NT, ND, + BS Extremities- no clubbing, cyanosis, or edema MS- no significant deformity or atrophy Skin- no rash or lesion Psych- euthymic mood, full affect   Labs:   Lab Results  Component Value Date   WBC 7.7 10/20/2016   HGB 15.1 10/20/2016   HCT 43.1 10/20/2016   MCV 87.6 10/20/2016   PLT 180 10/20/2016     Recent Labs Lab 10/20/16 0415 10/20/16 1649  NA 134* 134*  K 2.9* 3.7  CL 101 100*  CO2 26 26  BUN 10 12  CREATININE 1.08 1.32*  CALCIUM 8.9  9.3  PROT 6.3*  --   BILITOT 2.0*  --   ALKPHOS 54  --   ALT 27  --   AST 18  --   GLUCOSE 236* 240*     Radiology/Studies: Mr Brain Wo Contrast Result Date: 10/20/2016 CLINICAL DATA:  Acute onset of dizziness and blurry vision while driving, LEFT-sided weakness and slurred speech. Evaluate transient ischemic attack. History of hypertension and diabetes. EXAM: MRI HEAD WITHOUT CONTRAST MRA HEAD WITHOUT CONTRAST TECHNIQUE: Multiplanar, multiecho pulse sequences of the brain and surrounding structures were obtained without intravenous contrast. Angiographic images of the head were obtained using MRA technique without contrast. COMPARISON:  CT HEAD October 19, 2016 and MRI/MRA of the head January 11, 2012 FINDINGS: MRI HEAD FINDINGS BRAIN: Fall wide 10 mm reduced diffusion RIGHT medial thalamus with low ADC values. No susceptibility artifact to suggest hemorrhage. The ventricles and sulci are normal for patient's age. Old small LEFT cerebellar infarct. A few scattered punctate supratentorial white matter FLAIR T2 hyperintensities. No suspicious parenchymal signal, masses or mass effect. No abnormal extra-axial fluid collections. VASCULAR: Normal major intracranial vascular flow voids present at skull base. SKULL AND UPPER CERVICAL SPINE: No abnormal sellar expansion. No suspicious calvarial bone marrow signal. Craniocervical junction maintained. SINUSES/ORBITS: Lobulated RIGHT maxillary sinus mucosal thickening. Mastoid air cells are well aerated. The included ocular globes and orbital contents are non-suspicious. OTHER: None. MRA HEAD FINDINGS ANTERIOR CIRCULATION: Normal flow related enhancement of the included cervical, petrous, cavernous and supraclinoid internal carotid arteries. Patent anterior communicating artery. Normal flow related enhancement of the anterior and middle cerebral arteries, including distal segments. No large vessel occlusion, high-grade stenosis, abnormal luminal irregularity, aneurysm.  POSTERIOR CIRCULATION: RIGHT vertebral artery is dominant. Basilar artery is patent, with normal flow related enhancement of the main branch vessels. Normal flow related enhancement of the posterior cerebral arteries. Bilateral posterior communicating arteries present. No large vessel occlusion, high-grade stenosis, abnormal luminal irregularity, aneurysm. ANATOMIC VARIANTS: None. Source images and MIP images were reviewed. IMPRESSION: MRI HEAD: Acute 1 cm RIGHT thalamus infarct. Old small LEFT cerebellar infarct and minimal chronic small vessel ischemic disease. MRA HEAD: Negative. Electronically Signed   By: Awilda Metro M.D.   On: 10/20/2016 05:43   12-lead ECG sinus rhythm, rate 85 - personally reviewed  All prior EKG's in EPIC reviewed with no documented atrial fibrillation  Telemetry sinus rhythm (previous strips reviewed) - personally reviewed   Assessment and Plan:  1. Cryptogenic stroke The patient presented with cryptogenic stroke.  The patient has a TEE planned for this AM.  I spoke at length with the patient about monitoring for afib with an implantable loop recorder.  Risks, benefits, and alteratives to implantable loop recorder were discussed with the patient today.   At  this time, the patient is very clear in their decision to proceed with implantable loop recorder.   Wound care was reviewed with the patient (keep incision clean and dry for 3 days).   Please call with questions.   Lewayne Bunting, MD 10/26/2016 10:13 PM

## 2016-10-26 NOTE — Progress Notes (Signed)
  Echocardiogram Echocardiogram Transesophageal has been performed.  Matas Burrows T Edouard Gikas 10/26/2016, 10:00 AM

## 2016-10-26 NOTE — CV Procedure (Signed)
TRANSESOPHAGEAL ECHOCARDIOGRAM (TEE) NOTE  INDICATIONS: stroke  PROCEDURE:   Informed consent was obtained prior to the procedure. The risks, benefits and alternatives for the procedure were discussed and the patient comprehended these risks.  Risks include, but are not limited to, cough, sore throat, vomiting, nausea, somnolence, esophageal and stomach trauma or perforation, bleeding, low blood pressure, aspiration, pneumonia, infection, trauma to the teeth and death.    After a procedural time-out, the patient was given 4 mg versed and 50 mcg fentanyl for moderate sedation.  The patient's heart rate, blood pressure, and oxygen saturation are monitored continuously during the procedure.The oropharynx was anesthetized 10 cc of topical 1% viscous lidocaine and 2 cetacaine sprays.  The transesophageal probe was inserted in the esophagus and stomach without difficulty and multiple views were obtained.  The patient was kept under observation until the patient left the procedure room.  The period of conscious sedation is 16 minutes, of which I was present face-to-face 100% of this time. The patient left the procedure room in stable condition.   Agitated microbubble saline contrast was administered.  COMPLICATIONS:    There were no immediate complications.  Findings:  1. LEFT VENTRICLE: The left ventricular wall thickness is normal.  The left ventricular cavity is normal in size. Wall motion is normal.  LVEF is 55-60%.  2. RIGHT VENTRICLE:  The right ventricle is normal in structure and function without any thrombus or masses.    3. LEFT ATRIUM:  The left atrium is mildly dilated in size without any thrombus or masses.  There is spontaneous echo contrast ("smoke") in the left atrium consistent with a low flow state.  4. LEFT ATRIAL APPENDAGE:  The left atrial appendage is small and windsock shaped, however, free of any thrombus or masses. The appendage has single lobes. Pulse doppler indicates  high flow in the appendage.  5. ATRIAL SEPTUM:  The atrial septum demonstrates hypermobility. There is a very small PFO noted by saline microbubble without provocation, but not apparently by color doppler. This was not significantly worse with provocation.  6. RIGHT ATRIUM:  The right atrium is normal in size and function without any thrombus or masses.  7. MITRAL VALVE:  The mitral valve is normal in structure and function with trivial regurgitation.  There were no vegetations or stenosis.  8. AORTIC VALVE:  The aortic valve is trileaflet, normal in structure and function with no regurgitation.  There were no vegetations or stenosis  9. TRICUSPID VALVE:  The tricuspid valve is normal in structure and function with trivial regurgitation.  There were no vegetations or stenosis  10.  PULMONIC VALVE:  The pulmonic valve is normal in structure and function with trivial regurgitation.  There were no vegetations or stenosis.   11. AORTIC ARCH, ASCENDING AND DESCENDING AORTA:  There was no Myrtis Ser et. Al, 1992) atherosclerosis of the ascending aorta, aortic arch, or proximal descending aorta.  12. PULMONARY VEINS: Anomalous pulmonary venous return was not noted.  13. PERICARDIUM: The pericardium appeared normal and non-thickened.  There is no pericardial effusion.  IMPRESSION:   1. Very small PFO noted by saline microbubble contrast. 2. No LAA thrombus - however, some LA smoke is noted 3. No significant valvular disease 4. LVEF 55-60%, normal wall motion  RECOMMENDATIONS:    Very small PFO that could be implicated in stroke. Would consider proceeding with ILR to r/o occult atrial fibrillation.  Time Spent Directly with the Patient:  30 minutes   Chrystie Nose,  MD, [facility] HospitalFACC  [place]  [facility]  Attending Cardiologist  Direct Dial: [phone]  Fax: [phone]  Website:  [email or web address]  Kieren Adkison C Leshaun Biebel 10/26/2016, 9:43 AM

## 2016-10-26 NOTE — H&P (Signed)
   INTERVAL PROCEDURE H&P  History and Physical Interval Note:  10/26/2016 8:37 AM  Brian AcostaMichael Daniel has presented today for their planned procedure. The various methods of treatment have been discussed with the patient and family. After consideration of risks, benefits and other options for treatment, the patient has consented to the procedure.  The patients' outpatient history has been reviewed, patient examined, and no change in status from most recent office note within the past 30 days. I have reviewed the patients' chart and labs and will proceed as planned. Questions were answered to the patient's satisfaction.   Brian NoseKenneth C. Afsheen Antony, MD, [facility]buryFACC  [place]  [facility]  Attending Cardiologist  Direct Dial: [phone]  Fax: [phone]  Website:  [email or web address]  Brian Daniel 10/26/2016, 8:37 AM

## 2016-11-08 ENCOUNTER — Ambulatory Visit (INDEPENDENT_AMBULATORY_CARE_PROVIDER_SITE_OTHER): Payer: BLUE CROSS/BLUE SHIELD | Admitting: *Deleted

## 2016-11-08 DIAGNOSIS — I639 Cerebral infarction, unspecified: Secondary | ICD-10-CM

## 2016-11-08 DIAGNOSIS — Z95818 Presence of other cardiac implants and grafts: Secondary | ICD-10-CM

## 2016-11-08 LAB — CUP PACEART INCLINIC DEVICE CHECK
MDC IDC PG IMPLANT DT: 20180620
MDC IDC SESS DTM: 20180703103217

## 2016-11-08 NOTE — Progress Notes (Signed)
Loop woundcheck in clinic. Steri-strips removed, incision edges approximated. No redness, swelling or drainage noted. Battery status: good. R-waves 0.4275mV. No episodes. Patient reports that he went to the ER yesterday d/t symptoms of his prior CVA, they performed an MRI at [facility] without checking the LINQ. Lifetime episode counts are 0.  Patient educated about wound care and Carelink monitoring. Monthly summary reports and ROV with GT PRN.

## 2016-11-12 ENCOUNTER — Encounter: Payer: Self-pay | Admitting: Internal Medicine

## 2016-11-14 ENCOUNTER — Telehealth: Payer: Self-pay | Admitting: *Deleted

## 2016-11-14 NOTE — Telephone Encounter (Addendum)
Spoke with patient regarding sending manual transmission. 3 tachy episodes noted, 1 ECG available. Patient states no symptoms. Patient verbalized understanding and stated he will send it tonight when he gets home from work. Advised patient will review it once we receive it.

## 2016-11-15 NOTE — Telephone Encounter (Signed)
Called pt to schedule appt, states he must not be too bad if he doesn't need to see him for 2-4 weeks, and he doesn't want to take off work and come in, pay a copay just to be told he's fine. Please dcall with more info as to why he needs to be seen and why that time frame-pls call [phone]

## 2016-11-15 NOTE — Telephone Encounter (Addendum)
Spoke with patient regarding receiving manual transmission. Reviewed ECGs with Dr. Ladona Ridgelaylor, appears SVT, recommends patient schedules an appointment in 2-4 weeks. Advised patient Brian Daniel will be calling him to schedule the appointment. Patient verbalized appointment.   Message sent to Brian Daniel to schedule appointment.

## 2016-11-16 NOTE — Telephone Encounter (Signed)
I explained that [facility], the episode does not show a dangerous or fatal arrhythmia. Dr. Ladona Ridgelaylor would like to address the SVT but does not feel it is emergent- Brian Daniel verbalizes understanding and is willing to schedule appointment.

## 2016-11-17 NOTE — Telephone Encounter (Signed)
Appointment scheduled 12/06/16 at 4:15 with Dr. Ladona Ridgelaylor.

## 2016-11-22 ENCOUNTER — Encounter: Payer: Self-pay | Admitting: *Deleted

## 2016-11-25 ENCOUNTER — Ambulatory Visit (INDEPENDENT_AMBULATORY_CARE_PROVIDER_SITE_OTHER): Payer: BLUE CROSS/BLUE SHIELD | Admitting: *Deleted

## 2016-11-25 DIAGNOSIS — I639 Cerebral infarction, unspecified: Secondary | ICD-10-CM

## 2016-11-29 NOTE — Progress Notes (Signed)
Carelink Summary Report / Loop Recorder 

## 2016-12-06 ENCOUNTER — Ambulatory Visit (INDEPENDENT_AMBULATORY_CARE_PROVIDER_SITE_OTHER): Payer: BLUE CROSS/BLUE SHIELD | Admitting: Internal Medicine

## 2016-12-06 ENCOUNTER — Encounter: Payer: Self-pay | Admitting: Internal Medicine

## 2016-12-06 VITALS — BP 134/82 | HR 102 | Ht 69.0 in | Wt 199.8 lb

## 2016-12-06 DIAGNOSIS — Z95818 Presence of other cardiac implants and grafts: Secondary | ICD-10-CM

## 2016-12-06 DIAGNOSIS — I639 Cerebral infarction, unspecified: Secondary | ICD-10-CM

## 2016-12-06 NOTE — Progress Notes (Signed)
HPI Brian Daniel returns today for ongoing followup of his cryptogenic stroke. He is a pleasant 51 yo man with TIA's who has had an ILR placed. He has not had any additional symptoms in the past few months. The patient has a h/o chest pain. He has been placed on high dose statin therapy and plavix.  No Known Allergies   Current Outpatient Prescriptions  Medication Sig Dispense Refill  . atorvastatin (LIPITOR) 10 MG tablet Take 1 tablet (10 mg total) by mouth daily at 6 PM. 30 tablet 0  . clopidogrel (PLAVIX) 75 MG tablet Take 1 tablet (75 mg total) by mouth daily. 30 tablet 0  . liraglutide (VICTOZA) 18 MG/3ML SOPN Inject 1.8 mg into the skin daily.     Marland Kitchen. lisinopril-hydrochlorothiazide (PRINZIDE,ZESTORETIC) 10-12.5 MG tablet Take 1 tablet by mouth daily.    . Multiple Vitamins-Minerals (MULTIVITAMIN WITH MINERALS) tablet Take 1 tablet by mouth daily.    . sitaGLIPtin-metformin (JANUMET) 50-500 MG tablet Take 1 tablet by mouth 2 (two) times daily with a meal.    . tadalafil (CIALIS) 5 MG tablet Take 5 mg by mouth daily as needed for erectile dysfunction.    . tamsulosin (FLOMAX) 0.4 MG CAPS capsule Take 0.4 mg by mouth daily.  0   No current facility-administered medications for this visit.      Past Medical History:  Diagnosis Date  . Depression   . Diabetes mellitus without complication (HCC)   . Hypertension   . Sleep apnea    uses CPAP  . TIA (transient ischemic attack)     ROS:   All systems reviewed and negative except as noted in the HPI.   Past Surgical History:  Procedure Laterality Date  . CHOLECYSTECTOMY    . JOINT REPLACEMENT    . LOOP RECORDER INSERTION N/A 10/26/2016   Procedure: Loop Recorder Insertion;  Surgeon: Marinus Mawaylor, Gregg W, MD;  [facility];  Service: Cardiovascular;  Laterality: N/A;  . TEE WITHOUT CARDIOVERSION N/A 10/26/2016   Procedure: TRANSESOPHAGEAL ECHOCARDIOGRAM (TEE);  Surgeon: Chrystie NoseHilty, Kenneth C, MD;  [facility];   Service: Cardiovascular;  Laterality: N/A;     Family History  Problem Relation Age of Onset  . Stroke Mother   . COPD Mother      Social History   Social History  . Marital status: Married    Spouse name: N/A  . Number of children: N/A  . Years of education: N/A   Occupational History  . Not on file.   Social History Main Topics  . Smoking status: Never Smoker  . Smokeless tobacco: Never Used  . Alcohol use Yes     Comment: occ  . Drug use: No  . Sexual activity: Not on file   Other Topics Concern  . Not on file   Social History Narrative  . No narrative on file     BP 134/82   Pulse (!) 102   Ht 5\' 9"  (1.753 m)   Wt 199 lb 12.8 oz (90.6 kg)   SpO2 97%   BMI 29.51 kg/m   Physical Exam:  Well appearing middle aged man, NAD HEENT: Unremarkable Neck:  6 cm JVD, no thyromegally Lymphatics:  No adenopathy Back:  No CVA tenderness Lungs:  Clear with no wheezes HEART:  Regular rate rhythm, no murmurs, no rubs, no clicks Abd:  soft, positive bowel sounds, no organomegally, no rebound, no guarding Ext:  2 plus pulses, no edema, no cyanosis,  no clubbing Skin:  No rashes no nodules Neuro:  CN II through XII intact, motor grossly intact  ILR interogation - no atrial fib or bradycardia. He had sinus tachy vs SVT at 180/min.    Assess/Plan: 1. TIA - the etiology is unclear. I have recommended he continue his current meds. He will undergo watchful waiting. 2. Essential HTN - his blood pressure is minimally elevated. No change in meds. 3. Dizziness - we discussed having him hold his diuretic therapy. I have asked him to remain hydrated. I would anticipate he stop his HTCT and just use lisinopril in the future.   Brian Daniel,M.D.

## 2016-12-06 NOTE — Patient Instructions (Addendum)

## 2016-12-10 NOTE — Progress Notes (Signed)
Carelink summary report received. Battery status OK. Normal device function. No new symptom episodes, tachy episodes, brady, or pause episodes. No new AF episodes. Monthly summary reports and ROV/PRN 

## 2016-12-15 LAB — CUP PACEART REMOTE DEVICE CHECK
Date Time Interrogation Session: 20180720223804
MDC IDC PG IMPLANT DT: 20180620

## 2016-12-20 LAB — CUP PACEART INCLINIC DEVICE CHECK
Date Time Interrogation Session: 20180731202304
Implantable Pulse Generator Implant Date: 20180620

## 2016-12-26 ENCOUNTER — Ambulatory Visit (INDEPENDENT_AMBULATORY_CARE_PROVIDER_SITE_OTHER): Payer: BLUE CROSS/BLUE SHIELD | Admitting: *Deleted

## 2016-12-26 DIAGNOSIS — I639 Cerebral infarction, unspecified: Secondary | ICD-10-CM | POA: Diagnosis not present

## 2016-12-27 ENCOUNTER — Encounter: Payer: Self-pay | Admitting: Diagnostic Neuroimaging

## 2016-12-27 ENCOUNTER — Ambulatory Visit (INDEPENDENT_AMBULATORY_CARE_PROVIDER_SITE_OTHER): Payer: BLUE CROSS/BLUE SHIELD | Admitting: Diagnostic Neuroimaging

## 2016-12-27 VITALS — BP 126/76 | HR 76 | Ht 69.0 in | Wt 202.4 lb

## 2016-12-27 DIAGNOSIS — E1149 Type 2 diabetes mellitus with other diabetic neurological complication: Secondary | ICD-10-CM | POA: Diagnosis not present

## 2016-12-27 DIAGNOSIS — I6930 Unspecified sequelae of cerebral infarction: Secondary | ICD-10-CM

## 2016-12-27 DIAGNOSIS — I10 Essential (primary) hypertension: Secondary | ICD-10-CM

## 2016-12-27 NOTE — Patient Instructions (Signed)
Thank you for coming to see [place] at [facility]. I hope we have been able to provide you high quality care today.  You may receive a patient satisfaction survey over the next few weeks. We would appreciate your feedback and comments so that we may continue to improve ourselves and the health of our patients.  - continue plavix, atorvastatin, BP control, DM control, CPAP for OSA  - continue implanted loop recorder monitoring to eval for pAfib  - check MRA neck (with and without) to evaluate posterior circulation  - consider referral to endocrinology or nutritionist to improve diabetes control (per PCP)   ~~~~~~~~~~~~~~~~~~~~~~~~~~~~~~~~~~~~~~~~~~~~~~~~~~~~~~~~~~~~~~~~~  DR. Jhoan Schmieder'S GUIDE TO HAPPY AND HEALTHY LIVING These are some of my general health and wellness recommendations. Some of them may apply to you better than others. Please use common sense as you try these suggestions and feel free to ask me any questions.   ACTIVITY/FITNESS Mental, social, emotional and physical stimulation are very important for brain and body health. Try learning a new activity (arts, music, language, sports, games).  Keep moving your body to the best of your abilities. You can do this at home, inside or outside, the park, community center, gym or anywhere you like. Consider a physical therapist or personal trainer to get started. Consider the app Sworkit. Fitness trackers such as smart-watches, smart-phones or Fitbits can help as well.   NUTRITION Eat more plants: colorful vegetables, nuts, seeds and berries.  Eat less sugar, salt, preservatives and processed foods.  Avoid toxins such as cigarettes and alcohol.  Drink water when you are thirsty. Warm water with a slice of lemon is an excellent morning drink to start the day.  Consider these websites for more information The Nutrition Source (https://www.henry-hernandez.biz/) [employer] Nutrition  (WindowBlog.ch)   RELAXATION Consider practicing mindfulness meditation or other relaxation techniques such as deep breathing, prayer, yoga, tai chi, massage. See website mindful.org or the apps Headspace or Calm to help get started.   SLEEP Try to get at least 7-8+ hours sleep per day. Regular exercise and reduced caffeine will help you sleep better. Practice good sleep hygeine techniques. See website sleep.org for more information.   PLANNING Prepare estate planning, living will, healthcare POA documents. Sometimes this is best planned with the help of an attorney. Theconversationproject.org and agingwithdignity.org are excellent resources.

## 2016-12-27 NOTE — Progress Notes (Signed)
Carelink Summary Report / Loop Recorder 

## 2016-12-27 NOTE — Progress Notes (Signed)
[facility]  PATIENT: Brian Daniel DOB: Mar 17, 1966  REFERRING CLINICIAN: Hongalgi, A HISTORY FROM: patient and chart review REASON FOR VISIT: new consult    HISTORICAL  CHIEF COMPLAINT:  Chief Complaint  Patient presents with  . Cerebrovascular Accident    rm 7, New Pt, hospital FU, "2 strokes within one month, possibly caused by Wellbutrin; wearing loop recorder x 3 years"    HISTORY OF PRESENT ILLNESS:   51 year old male with hypertension, diabetes, here for elevation of stroke.  Patient presented to the hospital in June 2018 with acute onset blurred vision, slurred speech, dizziness, left-sided facial droop, left-sided weakness.  Symptoms rapidly improved and therefore patient did not receive TPA.  He was found to have an acute right thalamic ischemic infarction with an old left cerebellar infarct.  Cardioembolic workup was pursued.  Since discharge patient has had TEE which showed a very small PFO with right-to-left shunting as well as implanted loop recorder was placed.  So far no cardiac arrhythmias have been found.  Of note patient had transient double vision on the left side in November 07, 2016 which resolved. Patient went to [facility] for evaluation in the emergency room. CT scan of the head and repeat MRI brain were negative for acute findings.   He was diagnosed with TIA advised to followup in outpatient neurology.   REVIEW OF SYSTEMS: Full 14 system review of systems performed and negative with exception of: Only as per history of present illness.  ALLERGIES: No Known Allergies  HOME MEDICATIONS: Outpatient Medications Prior to Visit  Medication Sig Dispense Refill  . atorvastatin (LIPITOR) 10 MG tablet Take 1 tablet (10 mg total) by mouth daily at 6 PM. 30 tablet 0  . clopidogrel (PLAVIX) 75 MG tablet Take 1 tablet (75 mg total) by mouth daily. 30 tablet 0  . liraglutide (VICTOZA) 18 MG/3ML SOPN Inject 1.8 mg into the skin  daily.     Marland Kitchen lisinopril-hydrochlorothiazide (PRINZIDE,ZESTORETIC) 10-12.5 MG tablet Take 1 tablet by mouth daily.    . Multiple Vitamins-Minerals (MULTIVITAMIN WITH MINERALS) tablet Take 1 tablet by mouth daily.    . sitaGLIPtin-metformin (JANUMET) 50-500 MG tablet Take 1 tablet by mouth 2 (two) times daily with a meal.    . tadalafil (CIALIS) 5 MG tablet Take 5 mg by mouth daily as needed for erectile dysfunction.    . tamsulosin (FLOMAX) 0.4 MG CAPS capsule Take 0.4 mg by mouth daily.  0   No facility-administered medications prior to visit.     PAST MEDICAL HISTORY: Past Medical History:  Diagnosis Date  . Depression   . Diabetes mellitus without complication (HCC)   . Hypertension   . Sleep apnea    uses CPAP  . Stroke [facility])    x 08 October 2016  . TIA (transient ischemic attack)     PAST SURGICAL HISTORY: Past Surgical History:  Procedure Laterality Date  . BACK SURGERY     lower  . BILATERAL KNEE ARTHROSCOPY    . CHOLECYSTECTOMY    . LOOP RECORDER INSERTION N/A 10/26/2016   Procedure: Loop Recorder Insertion;  Surgeon: Marinus Maw, MD;  Location: [facility] INVASIVE CV LAB;  Service: Cardiovascular;  Laterality: N/A;  . TEE WITHOUT CARDIOVERSION N/A 10/26/2016   Procedure: TRANSESOPHAGEAL ECHOCARDIOGRAM (TEE);  Surgeon: Chrystie Nose, MD;  Location: [facility] ENDOSCOPY;  Service: Cardiovascular;  Laterality: N/A;    FAMILY HISTORY: Family History  Problem Relation Age of Onset  . Stroke Mother   .  COPD Mother     SOCIAL HISTORY:  Social History   Social History  . Marital status: Legally Separated    Spouse name: N/A  . Number of children: 1  . Years of education: 95   Occupational History  .      [employer]   Social History Main Topics  . Smoking status: Never Smoker  . Smokeless tobacco: Never Used  . Alcohol use Yes     Comment: occ  . Drug use: No  . Sexual activity: Not on file   Other Topics Concern  . Not on file   Social History Narrative     Lives alone   Caffeine, morning coffee     PHYSICAL EXAM  GENERAL EXAM/CONSTITUTIONAL: Vitals:  Vitals:   12/27/16 1256  BP: 126/76  Pulse: 76  Weight: 202 lb 6.4 oz (91.8 kg)  Height: 5\' 9"  (1.753 m)     Body mass index is 29.89 kg/m.  Visual Acuity Screening   Right eye Left eye Both eyes  Without correction:     With correction: 20/20 20/20      Patient is in no distress; well developed, nourished and groomed; neck is supple  CARDIOVASCULAR:  Examination of carotid arteries is normal; no carotid bruits  Regular rate and rhythm, no murmurs  Examination of peripheral vascular system by observation and palpation is normal  EYES:  Ophthalmoscopic exam of optic discs and posterior segments is normal; no papilledema or hemorrhages  MUSCULOSKELETAL:  Gait, strength, tone, movements noted in Neurologic exam below  NEUROLOGIC: MENTAL STATUS:  No flowsheet data found.  awake, alert, oriented to person, place and time  recent and remote memory intact  normal attention and concentration  language fluent, comprehension intact, naming intact,   fund of knowledge appropriate  CRANIAL NERVE:   2nd - no papilledema on fundoscopic exam  2nd, 3rd, 4th, 6th - pupils equal and reactive to light, visual fields full to confrontation, extraocular muscles intact, no nystagmus  5th - facial sensation symmetric  7th - facial strength symmetric  8th - hearing intact  9th - palate elevates symmetrically, uvula midline  11th - shoulder shrug symmetric  12th - tongue protrusion midline  MOTOR:   normal bulk and tone, full strength in the BUE, BLE  SENSORY:   normal and symmetric to light touch, pinprick, temperature, vibration  COORDINATION:   finger-nose-finger, fine finger movements normal  REFLEXES:   deep tendon reflexes present and symmetric; TRACE AT ANKLES  GAIT/STATION:   narrow based gait; romberg is negative    DIAGNOSTIC DATA (LABS,  IMAGING, TESTING) - I reviewed patient records, labs, notes, testing and imaging myself where available.  Lab Results  Component Value Date   WBC 7.7 10/20/2016   HGB 15.1 10/20/2016   HCT 43.1 10/20/2016   MCV 87.6 10/20/2016   PLT 180 10/20/2016      Component Value Date/Time   NA 134 (L) 10/20/2016 1649   NA 132 (L) 01/11/2012 0523   K 3.7 10/20/2016 1649   K 3.8 01/11/2012 0523   CL 100 (L) 10/20/2016 1649   CL 97 (L) 01/11/2012 0523   CO2 26 10/20/2016 1649   CO2 28 01/11/2012 0523   GLUCOSE 240 (H) 10/20/2016 1649   GLUCOSE 353 (H) 01/11/2012 0523   BUN 12 10/20/2016 1649   BUN 16 01/11/2012 0523   CREATININE 1.32 (H) 10/20/2016 1649   CREATININE 1.30 01/11/2012 0523   CALCIUM 9.3 10/20/2016 1649   CALCIUM  9.4 01/11/2012 0523   PROT 6.3 (L) 10/20/2016 0415   PROT 7.8 01/11/2012 0523   ALBUMIN 3.7 10/20/2016 0415   ALBUMIN 4.1 01/11/2012 0523   AST 18 10/20/2016 0415   AST 18 01/11/2012 0523   ALT 27 10/20/2016 0415   ALT 39 01/11/2012 0523   ALKPHOS 54 10/20/2016 0415   ALKPHOS 77 01/11/2012 0523   BILITOT 2.0 (H) 10/20/2016 0415   BILITOT 1.9 (H) 01/11/2012 0523   GFRNONAA >60 10/20/2016 1649   GFRNONAA >60 01/11/2012 0523   GFRAA >60 10/20/2016 1649   GFRAA >60 01/11/2012 0523   Lab Results  Component Value Date   CHOL 145 10/20/2016   HDL 38 (L) 10/20/2016   LDLCALC 84 10/20/2016   TRIG 113 10/20/2016   CHOLHDL 3.8 10/20/2016   Lab Results  Component Value Date   HGBA1C 10.1 (H) 10/20/2016   No results found for: [id number] Lab Results  Component Value Date   TSH 2.596 05/05/2016    10/20/16 MRI HEAD: [I reviewed images myself and agree with interpretation. -VRP]  - Acute 1 cm RIGHT thalamus infarct. - Old small LEFT cerebellar infarct and minimal chronic small vessel ischemic disease.  10/20/16 MRA HEAD: [I reviewed images myself and agree with interpretation. -VRP]  - Negative.  10/20/16 carotid u/s - Bilateral: intimal wall thickening  CCA. 1-39% ICA plaquing. - Vertebral artery flow is antegrade.  10/20/16 TTE - Left ventricle: The cavity size was normal. Wall thickness was   normal. Systolic function was normal. The estimated ejection   fraction was in the range of 55% to 60%. Wall motion was normal;   there were no regional wall motion abnormalities. Left   ventricular diastolic function parameters were normal. - Atrial septum: No defect or patent foramen ovale   10/26/16 TEE  - Very small PFO with right to left shunting by saline microbubble but not color doppler.  11/07/16 MRI brain [I reviewed images myself and agree with interpretation. -VRP]  1. No acute intracranial process. 2. Subacute to old RIGHT thalamus lacunar infarct. Old LEFT cerebellar infarct. 3. Minimal chronic small vessel ischemic disease.     ASSESSMENT AND PLAN  51 y.o. year old male here with uncontrolled hypertension and diabetes, with acute right thalamic ischemic infarction in June 2018, with transient double vision in July 2018 (possible TIA).  Patient has had bilateral ischemic infarctions, in the posterior circulation, but raising possibility of proximal embolic source.  Patient has a small PFO associated with right to left shunting.  However based on calculated RoPE scoring there is a very low likelihood that PFO is related to patient's stroke.  Risk of paradoxical embolism (RoPE) score = 3; therefore 0% chance that stroke is due to PFO. Also 20% risk of 2 year recurrence of stroke/TIA.   Dx:  1. Chronic ischemic vertebrobasilar artery thalamic stroke   2. Essential hypertension   3. Type 2 diabetes mellitus with other neurologic complication, without long-term current use of insulin (HCC)      PLAN:  EMBOLIC STROKES UNKNOWN SOURCE (ESUS) - (right thalamic; left cerebellar) - continue plavix, atorvastatin, BP control, DM control, CPAP for OSA - continue implanted loop recorder monitoring to eval for pAfib - check MRA neck  (with and without) to evaluate posterior circulation - consider referral to endocrinology or nutritionist to improve diabetes control (per PCP)  Orders Placed This Encounter  Procedures  . MR MRA NECK W WO CONTRAST   Return in about 6 months (around 06/29/2017).  I reviewed images, labs, notes, records myself. I summarized findings and reviewed with patient, for this high risk condition (posterior circulation TIA and strokes) requiring high complexity decision making.    Suanne Marker, MD 12/27/2016, 1:19 PM Certified in Neurology, Neurophysiology and Neuroimaging  [facility] [address] [place], [place] [address] [phone]

## 2016-12-31 LAB — CUP PACEART REMOTE DEVICE CHECK
Date Time Interrogation Session: 20180819233739
Implantable Pulse Generator Implant Date: 20180620

## 2016-12-31 NOTE — Progress Notes (Signed)
Carelink summary report received. Battery status OK. Normal device function. No new symptom episodes, tachy episodes, brady, or pause episodes. No new AF episodes. Monthly summary reports and ROV/PRN 

## 2017-01-04 ENCOUNTER — Ambulatory Visit: Payer: BLUE CROSS/BLUE SHIELD

## 2017-01-05 ENCOUNTER — Telehealth: Payer: Self-pay | Admitting: Diagnostic Neuroimaging

## 2017-01-05 DIAGNOSIS — G450 Vertebro-basilar artery syndrome: Secondary | ICD-10-CM

## 2017-01-05 NOTE — Telephone Encounter (Signed)
Could not have MRA neck due to coil placement over the loop recorder. Therefore will check CTA neck.  Orders Placed This Encounter  Procedures  . CT ANGIO NECK W OR WO CONTRAST     Suanne MarkerVIKRAM R. PENUMALLI, MD 01/05/2017, 4:53 PM Certified in Neurology, Neurophysiology and Neuroimaging  [facility] [address], [address] [place], [place] [address] [phone]

## 2017-01-05 NOTE — Telephone Encounter (Signed)
Patient came in yesterday to have MRI, but he informed us that he had a loop recorder. He did not have the exam. I called the patient and he stated [facility]gelaylor [facility] did [facility]. Please advise does the exam need to be changed?

## 2017-01-05 NOTE — Telephone Encounter (Signed)
Correction he didn't have the MRA neck.

## 2017-01-06 NOTE — Telephone Encounter (Signed)
Patient is aware of the exam switch. I did inform her I [facility] and they should reach out to him to schedule. He understood.

## 2017-01-06 NOTE — Telephone Encounter (Signed)
Noted, thank you

## 2017-01-13 ENCOUNTER — Ambulatory Visit
Admission: RE | Admit: 2017-01-13 | Discharge: 2017-01-13 | Disposition: A | Discharge status: Home / Self Care | Payer: BLUE CROSS/BLUE SHIELD | Source: Ambulatory Visit | Attending: Diagnostic Neuroimaging | Admitting: Diagnostic Neuroimaging

## 2017-01-13 DIAGNOSIS — G450 Vertebro-basilar artery syndrome: Secondary | ICD-10-CM

## 2017-01-13 MED ORDER — IOPAMIDOL (ISOVUE-370) INJECTION 76%
75.0000 mL | Freq: Once | INTRAVENOUS | Status: AC | PRN
Start: 2017-01-13 — End: 2017-01-13
  Administered 2017-01-13: 75 mL via INTRAVENOUS

## 2017-01-19 ENCOUNTER — Telehealth: Payer: Self-pay | Admitting: *Deleted

## 2017-01-19 NOTE — Telephone Encounter (Signed)
Spoke to pt and relayed that CTA results are unremarkable.  Continue current plan of care. He verbalized understanding.

## 2017-01-19 NOTE — Telephone Encounter (Signed)
-----   Message from Vikram R Penumalli, MD sent at 01/18/2017  9:01 PM EDT ----- Unremarkable imaging results. Please call patient. Continue current plan. -VRP 

## 2017-01-24 ENCOUNTER — Ambulatory Visit (INDEPENDENT_AMBULATORY_CARE_PROVIDER_SITE_OTHER): Payer: BLUE CROSS/BLUE SHIELD | Admitting: *Deleted

## 2017-01-24 DIAGNOSIS — I639 Cerebral infarction, unspecified: Secondary | ICD-10-CM

## 2017-01-25 NOTE — Progress Notes (Signed)
Carelink Summary Report / Loop Recorder 

## 2017-01-26 LAB — CUP PACEART REMOTE DEVICE CHECK
Date Time Interrogation Session: 20180918233807
MDC IDC PG IMPLANT DT: 20180620

## 2017-01-30 LAB — CUP PACEART REMOTE DEVICE CHECK
Implantable Pulse Generator Implant Date: 20180620
MDC IDC SESS DTM: 20180730091458

## 2017-02-23 ENCOUNTER — Ambulatory Visit (INDEPENDENT_AMBULATORY_CARE_PROVIDER_SITE_OTHER): Payer: BLUE CROSS/BLUE SHIELD | Admitting: *Deleted

## 2017-02-23 DIAGNOSIS — I639 Cerebral infarction, unspecified: Secondary | ICD-10-CM

## 2017-02-24 LAB — CUP PACEART REMOTE DEVICE CHECK
Date Time Interrogation Session: 20181019000704
MDC IDC PG IMPLANT DT: 20180620

## 2017-02-24 NOTE — Progress Notes (Signed)
Carelink Summary Report / Loop Recorder 

## 2017-03-27 ENCOUNTER — Ambulatory Visit (INDEPENDENT_AMBULATORY_CARE_PROVIDER_SITE_OTHER): Payer: BLUE CROSS/BLUE SHIELD | Admitting: *Deleted

## 2017-03-27 DIAGNOSIS — I639 Cerebral infarction, unspecified: Secondary | ICD-10-CM | POA: Diagnosis not present

## 2017-03-28 NOTE — Progress Notes (Signed)
Carelink Summary Report / Loop Recorder 

## 2017-04-11 LAB — CUP PACEART REMOTE DEVICE CHECK
Date Time Interrogation Session: 20181118014352
MDC IDC PG IMPLANT DT: 20180620

## 2017-04-24 ENCOUNTER — Ambulatory Visit (INDEPENDENT_AMBULATORY_CARE_PROVIDER_SITE_OTHER): Payer: BLUE CROSS/BLUE SHIELD | Admitting: *Deleted

## 2017-04-24 DIAGNOSIS — I639 Cerebral infarction, unspecified: Secondary | ICD-10-CM | POA: Diagnosis not present

## 2017-04-25 NOTE — Progress Notes (Signed)
Carelink Summary Report / Loop Recorder 

## 2017-04-26 ENCOUNTER — Other Ambulatory Visit: Payer: Self-pay | Admitting: Internal Medicine

## 2017-04-27 ENCOUNTER — Other Ambulatory Visit: Payer: Self-pay | Admitting: Internal Medicine

## 2017-05-10 LAB — CUP PACEART REMOTE DEVICE CHECK
Implantable Pulse Generator Implant Date: 20180620
MDC IDC SESS DTM: 20181218013753

## 2017-05-24 ENCOUNTER — Ambulatory Visit (INDEPENDENT_AMBULATORY_CARE_PROVIDER_SITE_OTHER): Payer: BLUE CROSS/BLUE SHIELD | Admitting: *Deleted

## 2017-05-24 ENCOUNTER — Other Ambulatory Visit: Payer: Self-pay | Admitting: Internal Medicine

## 2017-05-24 DIAGNOSIS — I639 Cerebral infarction, unspecified: Secondary | ICD-10-CM

## 2017-05-25 NOTE — Progress Notes (Signed)
Carelink Summary Report / Loop Recorder 

## 2017-06-02 LAB — CUP PACEART REMOTE DEVICE CHECK
Date Time Interrogation Session: 20190117024003
Implantable Pulse Generator Implant Date: 20180620

## 2017-06-21 ENCOUNTER — Telehealth: Payer: Self-pay | Admitting: Cardiology

## 2017-06-21 NOTE — Telephone Encounter (Signed)
Spoke w/ pt and requested that he send a manual transmission b/c his home monitor has not updated in at least 14 days.   

## 2017-06-23 ENCOUNTER — Ambulatory Visit (INDEPENDENT_AMBULATORY_CARE_PROVIDER_SITE_OTHER): Payer: BLUE CROSS/BLUE SHIELD | Admitting: *Deleted

## 2017-06-23 DIAGNOSIS — I639 Cerebral infarction, unspecified: Secondary | ICD-10-CM

## 2017-06-26 NOTE — Progress Notes (Signed)
Carelink Summary Report / Loop Recorder 

## 2017-07-03 ENCOUNTER — Ambulatory Visit: Payer: BLUE CROSS/BLUE SHIELD | Admitting: Diagnostic Neuroimaging

## 2017-07-03 ENCOUNTER — Encounter: Payer: Self-pay | Admitting: Diagnostic Neuroimaging

## 2017-07-03 VITALS — BP 138/88 | HR 97 | Wt 203.2 lb

## 2017-07-03 DIAGNOSIS — I10 Essential (primary) hypertension: Secondary | ICD-10-CM | POA: Diagnosis not present

## 2017-07-03 DIAGNOSIS — E1149 Type 2 diabetes mellitus with other diabetic neurological complication: Secondary | ICD-10-CM

## 2017-07-03 DIAGNOSIS — I6930 Unspecified sequelae of cerebral infarction: Secondary | ICD-10-CM | POA: Diagnosis not present

## 2017-07-03 NOTE — Progress Notes (Signed)
[facility]  PATIENT: Brian AcostaMichael Daniel DOB: 1965-12-11  REFERRING CLINICIAN: Hongalgi, A HISTORY FROM: patient and chart review REASON FOR VISIT: follow up   HISTORICAL  CHIEF COMPLAINT:  Chief Complaint  Patient presents with  . Stroke    rm 6, "still have loop recorder, no new concerns"  . Follow-up    6 month    HISTORY OF PRESENT ILLNESS:   UPDATE (07/03/17, VRP): Since last visit, doing well. Tolerating meds. No alleviating or aggravating factors. Sugars are improved.   PRIOR HPI (12/27/16): 52 year old male with hypertension, diabetes, here for evaluation of stroke.  Patient presented to the hospital in June 2018 with acute onset blurred vision, slurred speech, dizziness, left-sided facial droop, left-sided weakness.  Symptoms rapidly improved and therefore patient did not receive TPA.  He was found to have an acute right thalamic ischemic infarction with an old left cerebellar infarct.  Cardioembolic workup was pursued.  Since discharge patient has had TEE which showed a very small PFO with right-to-left shunting as well as implanted loop recorder was placed.  So far no cardiac arrhythmias have been found.  Of note patient had transient double vision on the left side in November 07, 2016 which resolved. [facility] for evaluation in the emergency room. CT scan of the head and repeat MRI brain were negative for acute findings. He was diagnosed with TIA advised to followup in outpatient neurology.   REVIEW OF SYSTEMS: Full 14 system review of systems performed and negative with exception of: Only as per history of present illness.  ALLERGIES: No Known Allergies  HOME MEDICATIONS: Outpatient Medications Prior to Visit  Medication Sig Dispense Refill  . atorvastatin (LIPITOR) 10 MG tablet Take 1 tablet (10 mg total) by mouth daily at 6 PM. 30 tablet 0  . buPROPion (WELLBUTRIN SR) 150 MG 12 hr tablet Wellbutrin SR    .  clopidogrel (PLAVIX) 75 MG tablet Take 1 tablet (75 mg total) by mouth daily. 30 tablet 0  . liraglutide (VICTOZA) 18 MG/3ML SOPN Inject 1.8 mg into the skin daily.     Marland Kitchen. lisinopril-hydrochlorothiazide (PRINZIDE,ZESTORETIC) 10-12.5 MG tablet Take 1 tablet by mouth daily.    . Multiple Vitamins-Minerals (MULTIVITAMIN WITH MINERALS) tablet Take 1 tablet by mouth daily.    . sitaGLIPtin-metformin (JANUMET) 50-500 MG tablet Take 1 tablet by mouth 2 (two) times daily with a meal.    . tadalafil (CIALIS) 5 MG tablet Take 5 mg by mouth daily as needed for erectile dysfunction.    . tamsulosin (FLOMAX) 0.4 MG CAPS capsule Take 0.4 mg by mouth daily.  0   No facility-administered medications prior to visit.     PAST MEDICAL HISTORY: Past Medical History:  Diagnosis Date  . Depression   . Diabetes mellitus without complication (HCC)   . Hypertension   . Sciatica   . Sleep apnea    uses CPAP  . Stroke [facility](HCC)    x 08 October 2016  . TIA (transient ischemic attack)     PAST SURGICAL HISTORY: Past Surgical History:  Procedure Laterality Date  . BACK SURGERY     lower  . BILATERAL KNEE ARTHROSCOPY    . CHOLECYSTECTOMY    . LOOP RECORDER INSERTION N/A 10/26/2016   Procedure: Loop Recorder Insertion;  Surgeon: Marinus Mawaylor, Gregg W, MD;  [facility]: [facility]VAS[facility] CV LAB;  Service: Cardiovascular;  Laterality: N/A;  . TEE WITHOUT CARDIOVERSION N/A 10/26/2016   Procedure: TRANSESOPHAGEAL ECHOCARDIOGRAM (TEE);  Surgeon:  Chrystie Nose, MD;  Location: [facility] ENDOSCOPY;  Service: Cardiovascular;  Laterality: N/A;    FAMILY HISTORY: Family History  Problem Relation Age of Onset  . Stroke Mother   . COPD Mother     SOCIAL HISTORY:  Social History   Socioeconomic History  . Marital status: Legally Separated    Spouse name: Not on file  . Number of children: 1  . Years of education: 23  . Highest education level: Not on file  Social Needs  . Financial resource strain: Not on file  . Food insecurity -  worry: Not on file  . Food insecurity - inability: Not on file  . Transportation needs - medical: Not on file  . Transportation needs - non-medical: Not on file  Occupational History    Comment: [employer]  Tobacco Use  . Smoking status: Never Smoker  . Smokeless tobacco: Never Used  Substance and Sexual Activity  . Alcohol use: Yes    Comment: occ  . Drug use: No  . Sexual activity: Not on file  Other Topics Concern  . Not on file  Social History Narrative   Lives alone   Caffeine, morning coffee     PHYSICAL EXAM  GENERAL EXAM/CONSTITUTIONAL: Vitals:  Vitals:   07/03/17 1519  BP: 138/88  Pulse: 97  Weight: 203 lb 3.2 oz (92.2 kg)   Body mass index is 30.01 kg/m. No exam data present  Patient is in no distress; well developed, nourished and groomed; neck is supple  CARDIOVASCULAR:  Examination of carotid arteries is normal; no carotid bruits  Regular rate and rhythm, no murmurs  Examination of peripheral vascular system by observation and palpation is normal  EYES:  Ophthalmoscopic exam of optic discs and posterior segments is normal; no papilledema or hemorrhages  MUSCULOSKELETAL:  Gait, strength, tone, movements noted in Neurologic exam below  NEUROLOGIC: MENTAL STATUS:  No flowsheet data found.  awake, alert, oriented to person, place and time  recent and remote memory intact  normal attention and concentration  language fluent, comprehension intact, naming intact,   fund of knowledge appropriate  CRANIAL NERVE:   2nd - no papilledema on fundoscopic exam  2nd, 3rd, 4th, 6th - pupils equal and reactive to light, visual fields full to confrontation, extraocular muscles intact, no nystagmus  5th - facial sensation symmetric  7th - facial strength symmetric  8th - hearing intact  9th - palate elevates symmetrically, uvula midline  11th - shoulder shrug symmetric  12th - tongue protrusion midline  MOTOR:   normal bulk  and tone, full strength in the BUE, BLE  SENSORY:   normal and symmetric to light touch, pinprick, temperature, vibration  COORDINATION:   finger-nose-finger, fine finger movements normal  REFLEXES:   deep tendon reflexes present and symmetric; TRACE AT ANKLES  GAIT/STATION:   narrow based gait; romberg is negative    DIAGNOSTIC DATA (LABS, IMAGING, TESTING) - I reviewed patient records, labs, notes, testing and imaging myself where available.  Lab Results  Component Value Date   WBC 7.7 10/20/2016   HGB 15.1 10/20/2016   HCT 43.1 10/20/2016   MCV 87.6 10/20/2016   PLT 180 10/20/2016      Component Value Date/Time   NA 134 (L) 10/20/2016 1649   NA 132 (L) 01/11/2012 0523   K 3.7 10/20/2016 1649   K 3.8 01/11/2012 0523   CL 100 (L) 10/20/2016 1649   CL 97 (L) 01/11/2012 0523   CO2 26 10/20/2016 1649  CO2 28 01/11/2012 0523   GLUCOSE 240 (H) 10/20/2016 1649   GLUCOSE 353 (H) 01/11/2012 0523   BUN 12 10/20/2016 1649   BUN 16 01/11/2012 0523   CREATININE 1.32 (H) 10/20/2016 1649   CREATININE 1.30 01/11/2012 0523   CALCIUM 9.3 10/20/2016 1649   CALCIUM 9.4 01/11/2012 0523   PROT 6.3 (L) 10/20/2016 0415   PROT 7.8 01/11/2012 0523   ALBUMIN 3.7 10/20/2016 0415   ALBUMIN 4.1 01/11/2012 0523   AST 18 10/20/2016 0415   AST 18 01/11/2012 0523   ALT 27 10/20/2016 0415   ALT 39 01/11/2012 0523   ALKPHOS 54 10/20/2016 0415   ALKPHOS 77 01/11/2012 0523   BILITOT 2.0 (H) 10/20/2016 0415   BILITOT 1.9 (H) 01/11/2012 0523   GFRNONAA >60 10/20/2016 1649   GFRNONAA >60 01/11/2012 0523   GFRAA >60 10/20/2016 1649   GFRAA >60 01/11/2012 0523   Lab Results  Component Value Date   CHOL 145 10/20/2016   HDL 38 (L) 10/20/2016   LDLCALC 84 10/20/2016   TRIG 113 10/20/2016   CHOLHDL 3.8 10/20/2016   Lab Results  Component Value Date   HGBA1C 10.1 (H) 10/20/2016   No results found for: ZOXWRUEA54 Lab Results  Component Value Date   TSH 2.596 05/05/2016     10/20/16 MRI HEAD: [I reviewed images myself and agree with interpretation. -VRP]  - Acute 1 cm RIGHT thalamus infarct. - Old small LEFT cerebellar infarct and minimal chronic small vessel ischemic disease.  10/20/16 MRA HEAD: [I reviewed images myself and agree with interpretation. -VRP]  - Negative.  10/20/16 carotid u/s - Bilateral: intimal wall thickening CCA. 1-39% ICA plaquing. - Vertebral artery flow is antegrade.  10/20/16 TTE - Left ventricle: The cavity size was normal. Wall thickness was   normal. Systolic function was normal. The estimated ejection   fraction was in the range of 55% to 60%. Wall motion was normal;   there were no regional wall motion abnormalities. Left   ventricular diastolic function parameters were normal. - Atrial septum: No defect or patent foramen ovale   10/26/16 TEE  - Very small PFO with right to left shunting by saline microbubble but not color doppler.  11/07/16 MRI brain [I reviewed images myself and agree with interpretation. -VRP]  1. No acute intracranial process. 2. Subacute to old RIGHT thalamus lacunar infarct. Old LEFT cerebellar infarct. 3. Minimal chronic small vessel ischemic disease.  01/13/17 CTA neck - Unremarkable neck CTA.     ASSESSMENT AND PLAN  52 y.o. year old male here with uncontrolled hypertension and diabetes, with acute right thalamic ischemic infarction in June 2018, with transient double vision in July 2018 (possible TIA).  Patient has had bilateral ischemic infarctions, in the posterior circulation, but raising possibility of proximal embolic source.  Patient has a small PFO associated with right to left shunting.  However based on calculated RoPE scoring there is a very low likelihood that PFO is related to patient's stroke.  Risk of paradoxical embolism (RoPE) score = 3; therefore 0% chance that stroke is due to PFO. Also 20% risk of 2 year recurrence of stroke/TIA.   Dx:  1. Chronic ischemic  vertebrobasilar artery thalamic stroke   2. Essential hypertension   3. Type 2 diabetes mellitus with other neurologic complication, without long-term current use of insulin (HCC)      PLAN:  EMBOLIC STROKES UNKNOWN SOURCE (ESUS) - (right thalamic; left cerebellar) - continue plavix, atorvastatin, BP control, DM control, CPAP  for OSA - continue implanted loop recorder monitoring to eval for pAfib  Return if symptoms worsen or fail to improve, for return to PCP.    Suanne Marker, MD 07/03/2017, 4:09 PM Certified in Neurology, Neurophysiology and Neuroimaging  [facility] [address] [place], [place] [address] [phone]

## 2017-07-13 ENCOUNTER — Other Ambulatory Visit: Payer: Self-pay | Admitting: Internal Medicine

## 2017-07-24 LAB — CUP PACEART REMOTE DEVICE CHECK
Implantable Pulse Generator Implant Date: 20180620
MDC IDC SESS DTM: 20190216031322

## 2017-07-26 ENCOUNTER — Ambulatory Visit (INDEPENDENT_AMBULATORY_CARE_PROVIDER_SITE_OTHER): Payer: BLUE CROSS/BLUE SHIELD | Admitting: *Deleted

## 2017-07-26 DIAGNOSIS — I639 Cerebral infarction, unspecified: Secondary | ICD-10-CM | POA: Diagnosis not present

## 2017-07-27 NOTE — Progress Notes (Signed)
Carelink Summary Report / Loop Recorder 

## 2017-08-28 ENCOUNTER — Ambulatory Visit (INDEPENDENT_AMBULATORY_CARE_PROVIDER_SITE_OTHER): Payer: BLUE CROSS/BLUE SHIELD | Admitting: *Deleted

## 2017-08-28 DIAGNOSIS — I639 Cerebral infarction, unspecified: Secondary | ICD-10-CM

## 2017-08-29 NOTE — Progress Notes (Signed)
Carelink Summary Report / Loop Recorder 

## 2017-09-05 LAB — CUP PACEART REMOTE DEVICE CHECK
Implantable Pulse Generator Implant Date: 20180620
MDC IDC SESS DTM: 20190321033621

## 2017-09-25 LAB — CUP PACEART REMOTE DEVICE CHECK
Date Time Interrogation Session: 20190423080922
MDC IDC PG IMPLANT DT: 20180620

## 2017-10-03 ENCOUNTER — Ambulatory Visit (INDEPENDENT_AMBULATORY_CARE_PROVIDER_SITE_OTHER): Payer: BLUE CROSS/BLUE SHIELD | Admitting: *Deleted

## 2017-10-03 DIAGNOSIS — I639 Cerebral infarction, unspecified: Secondary | ICD-10-CM | POA: Diagnosis not present

## 2017-10-03 NOTE — Progress Notes (Signed)
Carelink Summary Report / Loop Recorder 

## 2017-10-25 LAB — CUP PACEART REMOTE DEVICE CHECK
Implantable Pulse Generator Implant Date: 20180620
MDC IDC SESS DTM: 20190526083530

## 2017-11-03 ENCOUNTER — Ambulatory Visit (INDEPENDENT_AMBULATORY_CARE_PROVIDER_SITE_OTHER): Payer: BLUE CROSS/BLUE SHIELD | Admitting: *Deleted

## 2017-11-03 DIAGNOSIS — I639 Cerebral infarction, unspecified: Secondary | ICD-10-CM

## 2017-11-03 NOTE — Progress Notes (Signed)
Carelink Summary Report / Loop Recorder 

## 2017-12-01 LAB — CUP PACEART REMOTE DEVICE CHECK
Implantable Pulse Generator Implant Date: 20180620
MDC IDC SESS DTM: 20190628094004

## 2017-12-04 ENCOUNTER — Ambulatory Visit (INDEPENDENT_AMBULATORY_CARE_PROVIDER_SITE_OTHER): Payer: BLUE CROSS/BLUE SHIELD | Admitting: *Deleted

## 2017-12-04 DIAGNOSIS — I639 Cerebral infarction, unspecified: Secondary | ICD-10-CM | POA: Diagnosis not present

## 2017-12-06 NOTE — Progress Notes (Signed)
Carelink Summary Report / Loop Recorder 

## 2018-01-09 ENCOUNTER — Ambulatory Visit (INDEPENDENT_AMBULATORY_CARE_PROVIDER_SITE_OTHER): Payer: BLUE CROSS/BLUE SHIELD | Admitting: *Deleted

## 2018-01-09 DIAGNOSIS — I639 Cerebral infarction, unspecified: Secondary | ICD-10-CM

## 2018-01-09 NOTE — Progress Notes (Signed)
Carelink Summary Report / Loop Recorder 

## 2018-01-15 LAB — CUP PACEART REMOTE DEVICE CHECK
Date Time Interrogation Session: 20190731093629
Implantable Pulse Generator Implant Date: 20180620

## 2018-01-18 IMAGING — MR MR MRA HEAD W/O CM
9 of 11 series · 30 of 48 positions shown · non-contrast
Comparison: CT HEAD October 19, 2016 and MRI/MRA of the [employer] [facility]

CLINICAL DATA: Acute onset of dizziness and blurry vision while
driving, LEFT-sided weakness and slurred speech. Evaluate transient
ischemic attack. History of hypertension and diabetes.

EXAM:
MRI HEAD WITHOUT CONTRAST
MRA HEAD WITHOUT CONTRAST
TECHNIQUE: Multiplanar, multiecho pulse sequences of the brain and surrounding
structures were obtained without intravenous contrast. Angiographic
images of the head were obtained using MRA technique without
contrast.

[Series 3: DWI · axial · 3.0mm · 0.94mm/px · z∈[-79,+68]mm · 6 of 100 slices shown (1 of 2)]
[im 1/100]
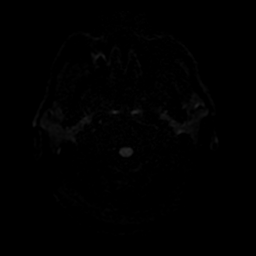
[im 20/100]
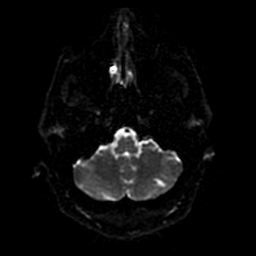
[im 40/100]
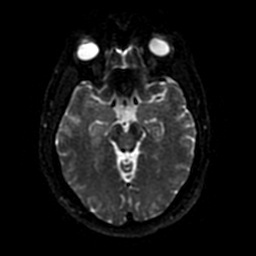
[im 60/100]
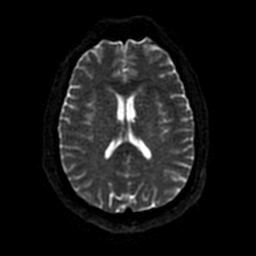
[im 80/100]
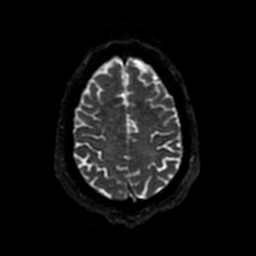
[im 100/100]
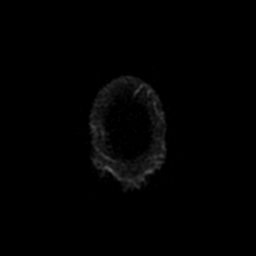

[Series 4: ax (id) 2 · axial · 1.0mm · 0.43mm/px · z∈[-54,-2]mm · 5 of 184 slices shown]
[im 1/184]
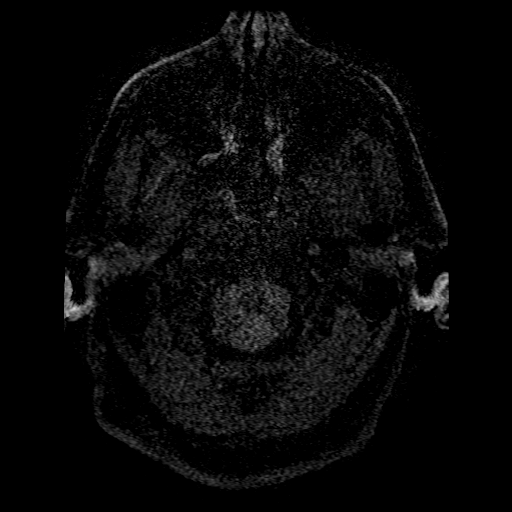
[im 31/184]
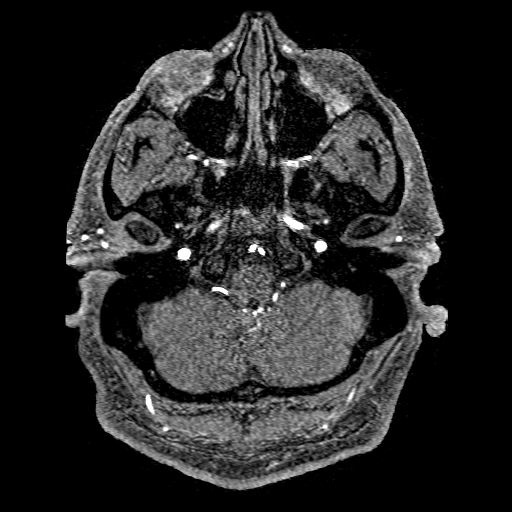
[im 62/184]
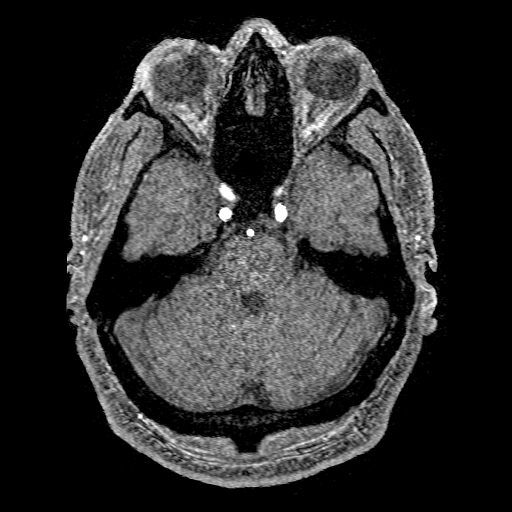
[im 77/184]
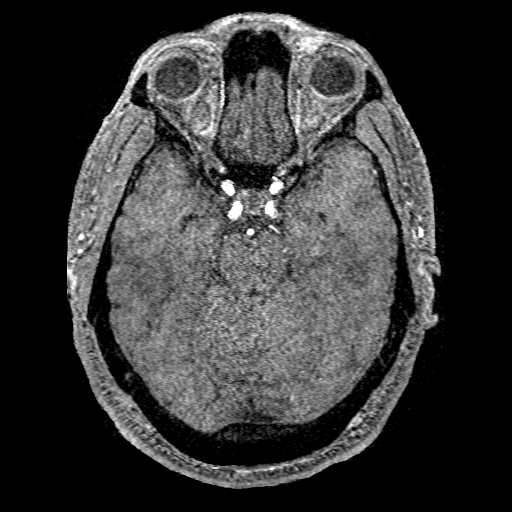
[im 107/184]
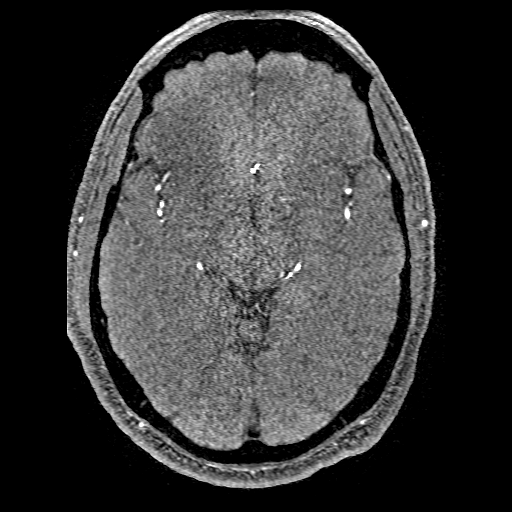

[Series 5: FLAIR · sagittal · 5.0mm · 0.47mm/px · 2 of 23 slices shown (1 of 2)]
[im 1/23]
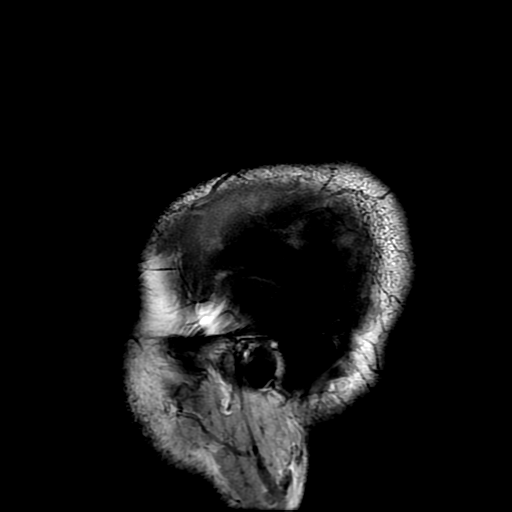
[im 23/23]
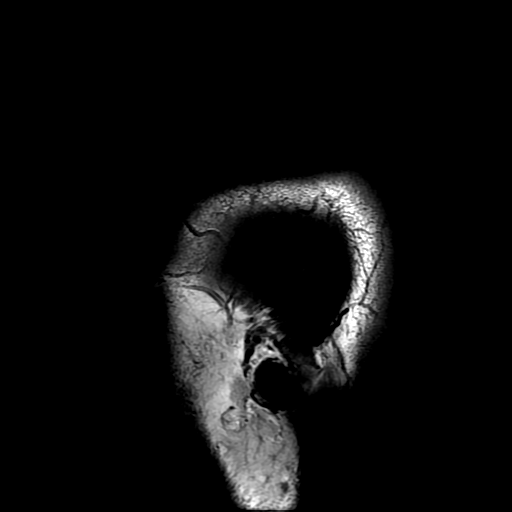

[Series 6: T2 · axial · 5.0mm · 0.43mm/px · z∈[-78,+66]mm · 2 of 25 slices shown (1 of 2)]
[im 1/25]
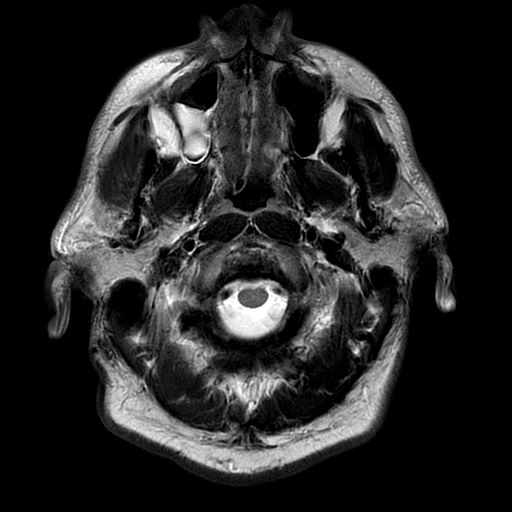
[im 25/25]
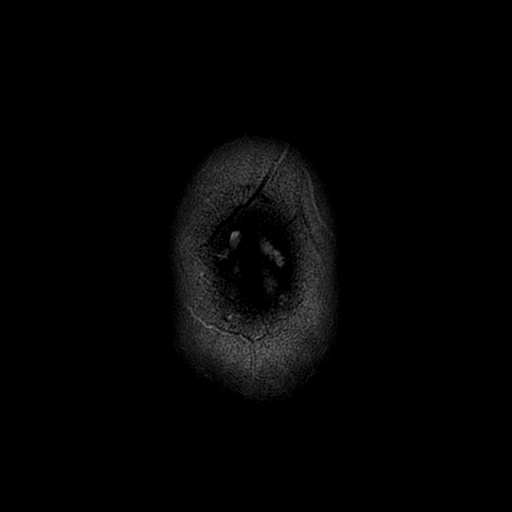

[Series 7: FLAIR · axial · 5.0mm · 0.43mm/px · z∈[-78,+66]mm · 2 of 25 slices shown (2 of 2)]
[im 1/25]
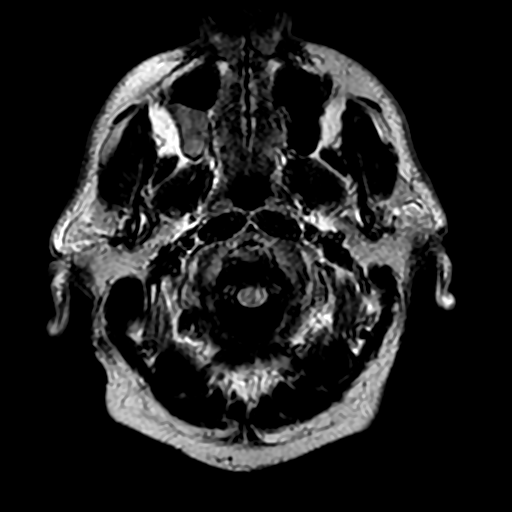
[im 25/25]
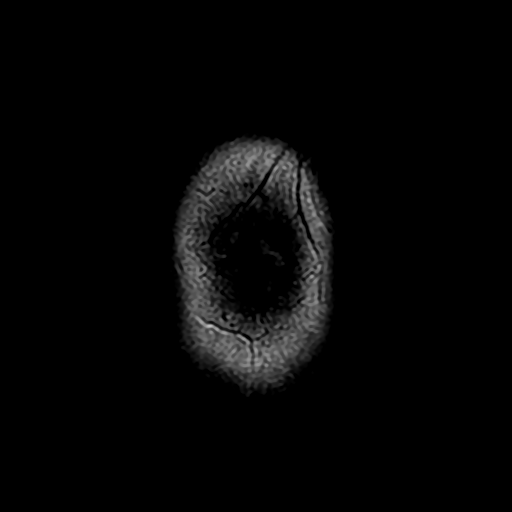

[Series 8: DWI · coronal · 4.0mm · 0.94mm/px · 5 of 72 slices shown (2 of 2)]
[im 1/72]
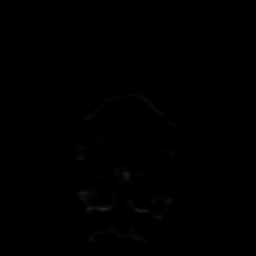
[im 18/72]
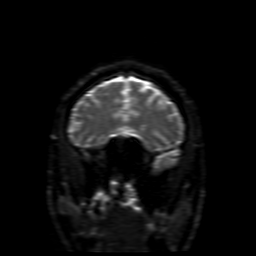
[im 36/72]
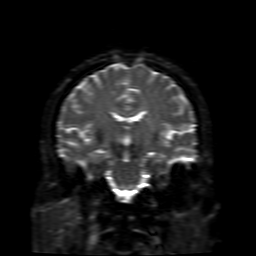
[im 54/72]
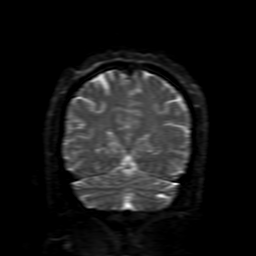
[im 72/72]
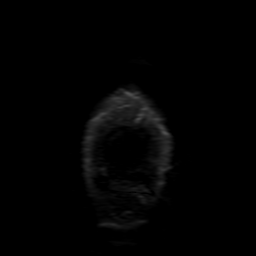

[Series 11: T2 · coronal · 5.0mm · 0.39mm/px · 2 of 30 slices shown (2 of 2)]
[im 1/30]
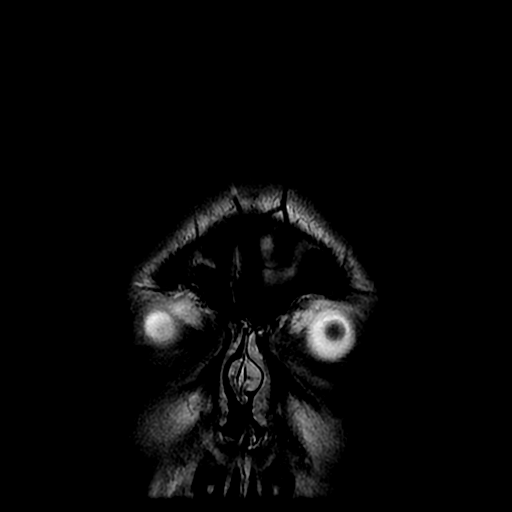
[im 30/30]
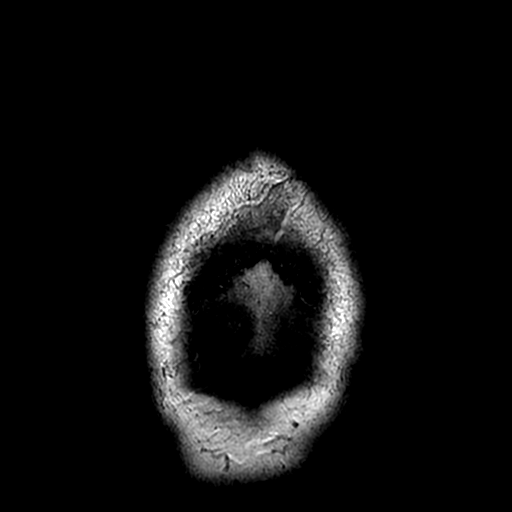

[Series 350: ADC · axial · 3.0mm · 0.94mm/px · z∈[-79,+68]mm · 3 of 48 slices shown (1 of 2)]
[im 1/48]
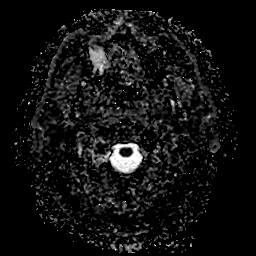
[im 24/48]
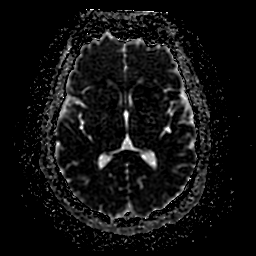
[im 48/48]
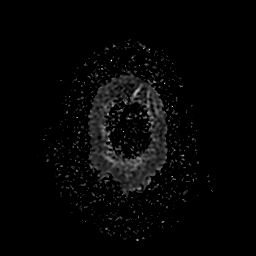

[Series 850: ADC · coronal · 4.0mm · 0.94mm/px · 3 of 36 slices shown (2 of 2)]
[im 1/36]
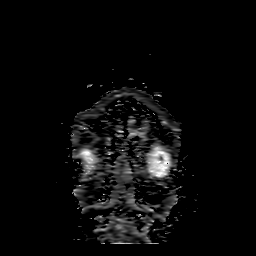
[im 18/36]
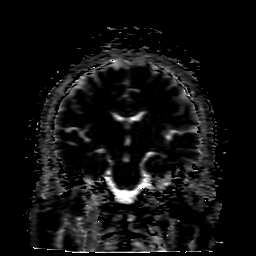
[im 36/36]
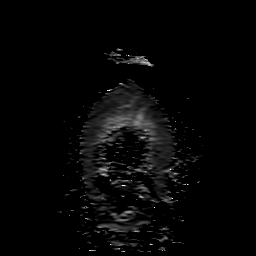

[30 of 48 positions shown; findings below may reference images not displayed]

FINDINGS: MRI HEAD FINDINGS

BRAIN: Fall wide 10 mm reduced diffusion RIGHT medial thalamus with
low ADC values. No susceptibility artifact to suggest hemorrhage.
The ventricles and sulci are normal for patient's age. Old small
LEFT cerebellar infarct. A few scattered punctate supratentorial
white matter FLAIR T2 hyperintensities. No suspicious parenchymal
signal, masses or mass effect. No abnormal extra-axial fluid
collections.

VASCULAR: Normal major intracranial vascular flow voids present at
skull base.

SKULL AND UPPER CERVICAL SPINE: No abnormal sellar expansion. No
suspicious calvarial bone marrow signal. Craniocervical junction
maintained.

SINUSES/ORBITS: Lobulated RIGHT maxillary sinus mucosal thickening.
Mastoid air cells are well aerated. The included ocular globes and
orbital contents are non-suspicious.

OTHER: None.

MRA HEAD FINDINGS

ANTERIOR CIRCULATION: Normal flow related enhancement of the
included cervical, petrous, cavernous and supraclinoid internal
carotid arteries. Patent anterior communicating artery. Normal flow
related enhancement of the anterior and middle cerebral arteries,
including distal segments.

No large vessel occlusion, high-grade stenosis, abnormal luminal
irregularity, aneurysm.

POSTERIOR CIRCULATION: RIGHT vertebral artery is dominant. Basilar
artery is patent, with normal flow related enhancement of the main
branch vessels. Normal flow related enhancement of the posterior
cerebral arteries. Bilateral posterior communicating arteries
present.

No large vessel occlusion, high-grade stenosis, abnormal luminal
irregularity, aneurysm.

ANATOMIC VARIANTS: None.

Source images and MIP images were reviewed.
IMPRESSION: MRI HEAD: Acute 1 cm RIGHT thalamus infarct.

Old small LEFT cerebellar infarct and minimal chronic small vessel
ischemic disease.

MRA HEAD: Negative.

## 2018-02-02 LAB — CUP PACEART REMOTE DEVICE CHECK
Implantable Pulse Generator Implant Date: 20180620
MDC IDC SESS DTM: 20190902100847

## 2018-02-12 ENCOUNTER — Ambulatory Visit (INDEPENDENT_AMBULATORY_CARE_PROVIDER_SITE_OTHER): Payer: BLUE CROSS/BLUE SHIELD | Admitting: *Deleted

## 2018-02-12 DIAGNOSIS — I639 Cerebral infarction, unspecified: Secondary | ICD-10-CM | POA: Diagnosis not present

## 2018-02-12 NOTE — Progress Notes (Signed)
Carelink Summary Report / Loop Recorder 

## 2018-02-16 ENCOUNTER — Ambulatory Visit: Payer: BLUE CROSS/BLUE SHIELD | Admitting: Internal Medicine

## 2018-02-16 ENCOUNTER — Encounter: Payer: Self-pay | Admitting: Internal Medicine

## 2018-02-16 VITALS — BP 136/86 | HR 87 | Ht 69.0 in | Wt 194.6 lb

## 2018-02-16 DIAGNOSIS — I10 Essential (primary) hypertension: Secondary | ICD-10-CM

## 2018-02-16 DIAGNOSIS — Z95818 Presence of other cardiac implants and grafts: Secondary | ICD-10-CM | POA: Diagnosis not present

## 2018-02-16 DIAGNOSIS — I639 Cerebral infarction, unspecified: Secondary | ICD-10-CM | POA: Diagnosis not present

## 2018-02-16 NOTE — Patient Instructions (Signed)

## 2018-02-16 NOTE — Progress Notes (Signed)
HPI Mr. 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 He is a pleasant 52 yo man with TIA's who has had an ILR placed. He has not had any additional symptoms in the past few months.  He has been placed on high dose statin therapy and plavix.  No Known Allergies   Current Outpatient Medications  Medication Sig Dispense Refill  . atorvastatin (LIPITOR) 10 MG tablet Take 1 tablet (10 mg total) by mouth daily at 6 PM. 30 tablet 0  . clopidogrel (PLAVIX) 75 MG tablet Take 1 tablet (75 mg total) by mouth daily. 30 tablet 0  . JANUVIA 100 MG tablet Take 1 tablet by mouth daily.  0  . JARDIANCE 25 MG TABS tablet Take 1 tablet by mouth daily.  0  . lisinopril (PRINIVIL,ZESTRIL) 20 MG tablet Take 1 tablet by mouth daily.  3  . meloxicam (MOBIC) 15 MG tablet Take 1 tablet by mouth as needed for mild pain.    . Multiple Vitamins-Minerals (MULTIVITAMIN WITH MINERALS) tablet Take 1 tablet by mouth daily.    Marland Kitchen OZEMPIC, 1 MG/DOSE, 2 MG/1.5ML SOPN Inject 1 mg into the skin once a week.  2  . tamsulosin (FLOMAX) 0.4 MG CAPS capsule Take 0.4 mg by mouth daily.  0   No current facility-administered medications for this visit.      Past Medical History:  Diagnosis Date  . Depression   . Diabetes mellitus without complication (HCC)   . Hypertension   . Sciatica   . Sleep apnea    uses CPAP  . Stroke [facility])    x 08 October 2016  . TIA (transient ischemic attack)     ROS:   All systems reviewed and negative except as noted in the HPI.   Past Surgical History:  Procedure Laterality Date  . BACK SURGERY     lower  . BILATERAL KNEE ARTHROSCOPY    . CHOLECYSTECTOMY    . LOOP RECORDER INSERTION N/A 10/26/2016   Procedure: Loop Recorder Insertion;  Surgeon: Marinus Maw, MD;  Location: [facility] INVASIVE CV LAB;  Service: Cardiovascular;  Laterality: N/A;  . TEE WITHOUT CARDIOVERSION N/A 10/26/2016   Procedure: TRANSESOPHAGEAL ECHOCARDIOGRAM (TEE);  Surgeon: Chrystie Nose,  MD;  Location: [facility] ENDOSCOPY;  Service: Cardiovascular;  Laterality: N/A;     Family History  Problem Relation Age of Onset  . Stroke Mother   . COPD Mother      Social History   Socioeconomic History  . Marital status: Legally Separated    Spouse name: Not on file  . Number of children: 1  . Years of education: 81  . Highest education level: Not on file  Occupational History    Comment: [employer]  Social Needs  . Financial resource strain: Not on file  . Food insecurity:    Worry: Not on file    Inability: Not on file  . Transportation needs:    Medical: Not on file    Non-medical: Not on file  Tobacco Use  . Smoking status: Never Smoker  . Smokeless tobacco: Never Used  Substance and Sexual Activity  . Alcohol use: Yes    Comment: occ  . Drug use: No  . Sexual activity: Not on file  Lifestyle  . Physical activity:    Days per week: Not on file    Minutes per session: Not on file  . Stress: Not on file  Relationships  . Social connections:  Talks on phone: Not on file    Gets together: Not on file    Attends religious service: Not on file    Active member of club or organization: Not on file    Attends meetings of clubs or organizations: Not on file    Relationship status: Not on file  . Intimate partner violence:    Fear of current or ex partner: Not on file    Emotionally abused: Not on file    Physically abused: Not on file    Forced sexual activity: Not on file  Other Topics Concern  . Not on file  Social History Narrative   Lives alone   Caffeine, morning coffee     BP 136/86   Pulse 87   Ht 5\' 9"  (1.753 m)   Wt 194 lb 9.6 oz (88.3 kg)   SpO2 99%   BMI 28.74 kg/m   Physical Exam:  Well appearing middle aged man, NAD HEENT: Unremarkable Neck:  6 cm JVD, no thyromegally Lymphatics:  No adenopathy Back:  No CVA tenderness Lungs:  Clear with no wheezes HEART:  Regular rate rhythm, no murmurs, no rubs, no clicks Abd:  soft,  positive bowel sounds, no organomegally, no rebound, no guarding Ext:  2 plus pulses, no edema, no cyanosis, no clubbing Skin:  No rashes no nodules Neuro:  CN II through XII intact, motor grossly intact  DEVICE  Normal device function.  See PaceArt for details. SVT is noted on ILR  Assess/Plan: 1. Cryptogenic stroke - no obvious etiology. He will undergo watchful waiting 2. SVT - he is asymptomatic. Cannot rule out sinus tachycardia. We will follow. 3. ILR - his device is working normally. We will recheck in office in a year.  Leonia Reeves.D.

## 2018-02-26 LAB — CUP PACEART REMOTE DEVICE CHECK
Implantable Pulse Generator Implant Date: 20180620
MDC IDC SESS DTM: 20191005100646

## 2018-03-15 ENCOUNTER — Ambulatory Visit (INDEPENDENT_AMBULATORY_CARE_PROVIDER_SITE_OTHER): Payer: BLUE CROSS/BLUE SHIELD | Admitting: *Deleted

## 2018-03-15 DIAGNOSIS — I639 Cerebral infarction, unspecified: Secondary | ICD-10-CM | POA: Diagnosis not present

## 2018-03-15 NOTE — Progress Notes (Signed)
Carelink Summary Report / Loop Recorder 

## 2018-04-17 ENCOUNTER — Ambulatory Visit (INDEPENDENT_AMBULATORY_CARE_PROVIDER_SITE_OTHER): Payer: BLUE CROSS/BLUE SHIELD

## 2018-04-17 DIAGNOSIS — I639 Cerebral infarction, unspecified: Secondary | ICD-10-CM

## 2018-04-19 NOTE — Progress Notes (Signed)
Carelink Summary Report / Loop Recorder 

## 2018-05-05 LAB — CUP PACEART REMOTE DEVICE CHECK
MDC IDC PG IMPLANT DT: 20180620
MDC IDC SESS DTM: 20191107104100

## 2018-05-21 ENCOUNTER — Ambulatory Visit (INDEPENDENT_AMBULATORY_CARE_PROVIDER_SITE_OTHER): Payer: BLUE CROSS/BLUE SHIELD

## 2018-05-21 DIAGNOSIS — I639 Cerebral infarction, unspecified: Secondary | ICD-10-CM

## 2018-05-22 NOTE — Progress Notes (Signed)
Carelink Summary Report / Loop Recorder 

## 2018-05-24 LAB — CUP PACEART REMOTE DEVICE CHECK
Implantable Pulse Generator Implant Date: 20180620
MDC IDC SESS DTM: 20200112110553

## 2018-05-27 LAB — CUP PACEART REMOTE DEVICE CHECK
Date Time Interrogation Session: 20191210103604
MDC IDC PG IMPLANT DT: 20180620

## 2018-06-22 ENCOUNTER — Ambulatory Visit (INDEPENDENT_AMBULATORY_CARE_PROVIDER_SITE_OTHER): Payer: BLUE CROSS/BLUE SHIELD

## 2018-06-22 DIAGNOSIS — I639 Cerebral infarction, unspecified: Secondary | ICD-10-CM | POA: Diagnosis not present

## 2018-06-22 LAB — CUP PACEART REMOTE DEVICE CHECK
Implantable Pulse Generator Implant Date: 20180620
MDC IDC SESS DTM: 20200214100236

## 2018-07-03 NOTE — Progress Notes (Signed)
Carelink Summary Report / Loop Recorder 

## 2018-07-25 ENCOUNTER — Ambulatory Visit (INDEPENDENT_AMBULATORY_CARE_PROVIDER_SITE_OTHER): Payer: BLUE CROSS/BLUE SHIELD | Admitting: *Deleted

## 2018-07-25 ENCOUNTER — Other Ambulatory Visit: Payer: Self-pay

## 2018-07-25 DIAGNOSIS — I639 Cerebral infarction, unspecified: Secondary | ICD-10-CM | POA: Diagnosis not present

## 2018-07-26 LAB — CUP PACEART REMOTE DEVICE CHECK
Date Time Interrogation Session: 20200318133927
Implantable Pulse Generator Implant Date: 20180620

## 2018-08-02 NOTE — Progress Notes (Signed)
Carelink Summary Report / Loop Recorder 

## 2018-08-27 ENCOUNTER — Ambulatory Visit (INDEPENDENT_AMBULATORY_CARE_PROVIDER_SITE_OTHER): Payer: BLUE CROSS/BLUE SHIELD | Admitting: *Deleted

## 2018-08-27 ENCOUNTER — Other Ambulatory Visit: Payer: Self-pay

## 2018-08-27 DIAGNOSIS — I639 Cerebral infarction, unspecified: Secondary | ICD-10-CM | POA: Diagnosis not present

## 2018-08-27 LAB — CUP PACEART REMOTE DEVICE CHECK
Date Time Interrogation Session: 20200420144054
Implantable Pulse Generator Implant Date: 20180620

## 2018-09-05 NOTE — Progress Notes (Signed)
Carelink Summary Report / Loop Recorder 

## 2018-10-01 LAB — CUP PACEART REMOTE DEVICE CHECK
Date Time Interrogation Session: 20200523153852
Implantable Pulse Generator Implant Date: 20180620

## 2018-10-02 ENCOUNTER — Ambulatory Visit (INDEPENDENT_AMBULATORY_CARE_PROVIDER_SITE_OTHER): Payer: BLUE CROSS/BLUE SHIELD | Admitting: *Deleted

## 2018-10-02 DIAGNOSIS — I639 Cerebral infarction, unspecified: Secondary | ICD-10-CM

## 2018-10-15 NOTE — Progress Notes (Signed)
Carelink Summary Report / Loop Recorder 

## 2018-11-01 ENCOUNTER — Ambulatory Visit (INDEPENDENT_AMBULATORY_CARE_PROVIDER_SITE_OTHER): Payer: BC Managed Care – PPO | Admitting: *Deleted

## 2018-11-01 DIAGNOSIS — I639 Cerebral infarction, unspecified: Secondary | ICD-10-CM | POA: Diagnosis not present

## 2018-11-01 LAB — CUP PACEART REMOTE DEVICE CHECK
Date Time Interrogation Session: 20200625161109
Implantable Pulse Generator Implant Date: 20180620

## 2018-11-12 NOTE — Progress Notes (Signed)
Carelink Summary Report / Loop Recorder 

## 2018-12-04 ENCOUNTER — Ambulatory Visit (INDEPENDENT_AMBULATORY_CARE_PROVIDER_SITE_OTHER): Payer: BC Managed Care – PPO | Admitting: *Deleted

## 2018-12-04 DIAGNOSIS — I639 Cerebral infarction, unspecified: Secondary | ICD-10-CM | POA: Diagnosis not present

## 2018-12-04 LAB — CUP PACEART REMOTE DEVICE CHECK
Date Time Interrogation Session: 20200728145227
Implantable Pulse Generator Implant Date: 20180620

## 2018-12-17 NOTE — Progress Notes (Signed)
Carelink Summary Report / Loop Recorder 

## 2019-01-07 ENCOUNTER — Ambulatory Visit (INDEPENDENT_AMBULATORY_CARE_PROVIDER_SITE_OTHER): Payer: BC Managed Care – PPO | Admitting: *Deleted

## 2019-01-07 DIAGNOSIS — I639 Cerebral infarction, unspecified: Secondary | ICD-10-CM

## 2019-01-07 LAB — CUP PACEART REMOTE DEVICE CHECK
Date Time Interrogation Session: 20200830184134
Implantable Pulse Generator Implant Date: 20180620

## 2019-01-16 NOTE — Progress Notes (Signed)
Carelink Summary Report / Loop Recorder 

## 2019-02-08 ENCOUNTER — Ambulatory Visit (INDEPENDENT_AMBULATORY_CARE_PROVIDER_SITE_OTHER): Payer: BC Managed Care – PPO | Admitting: *Deleted

## 2019-02-08 DIAGNOSIS — I639 Cerebral infarction, unspecified: Secondary | ICD-10-CM | POA: Diagnosis not present

## 2019-02-09 LAB — CUP PACEART REMOTE DEVICE CHECK
Date Time Interrogation Session: 20201002183958
Implantable Pulse Generator Implant Date: 20180620

## 2019-02-12 NOTE — Progress Notes (Signed)
Carelink Summary Report / Loop Recorder 

## 2019-03-13 ENCOUNTER — Ambulatory Visit (INDEPENDENT_AMBULATORY_CARE_PROVIDER_SITE_OTHER): Payer: BC Managed Care – PPO | Admitting: *Deleted

## 2019-03-13 DIAGNOSIS — I6389 Other cerebral infarction: Secondary | ICD-10-CM | POA: Diagnosis not present

## 2019-03-13 LAB — CUP PACEART REMOTE DEVICE CHECK
Date Time Interrogation Session: 20201104183858
Implantable Pulse Generator Implant Date: 20180620

## 2019-03-31 NOTE — Progress Notes (Signed)
Carelink Summary Report / Loop Recorder 

## 2019-04-15 ENCOUNTER — Ambulatory Visit (INDEPENDENT_AMBULATORY_CARE_PROVIDER_SITE_OTHER): Payer: BC Managed Care – PPO | Admitting: *Deleted

## 2019-04-15 DIAGNOSIS — I6389 Other cerebral infarction: Secondary | ICD-10-CM

## 2019-04-16 LAB — CUP PACEART REMOTE DEVICE CHECK
Date Time Interrogation Session: 20201208113236
Implantable Pulse Generator Implant Date: 20180620

## 2019-05-17 ENCOUNTER — Ambulatory Visit (INDEPENDENT_AMBULATORY_CARE_PROVIDER_SITE_OTHER): Payer: Self-pay | Admitting: *Deleted

## 2019-05-17 DIAGNOSIS — I6389 Other cerebral infarction: Secondary | ICD-10-CM

## 2019-05-19 LAB — CUP PACEART REMOTE DEVICE CHECK
Date Time Interrogation Session: 20210110114032
Implantable Pulse Generator Implant Date: 20180620

## 2019-06-17 ENCOUNTER — Ambulatory Visit (INDEPENDENT_AMBULATORY_CARE_PROVIDER_SITE_OTHER): Payer: BC Managed Care – PPO | Admitting: *Deleted

## 2019-06-17 DIAGNOSIS — I6389 Other cerebral infarction: Secondary | ICD-10-CM

## 2019-06-17 LAB — CUP PACEART REMOTE DEVICE CHECK
Date Time Interrogation Session: 20210207233241
Implantable Pulse Generator Implant Date: 20180620

## 2019-06-18 NOTE — Progress Notes (Signed)
ILR Remote 

## 2019-07-18 ENCOUNTER — Ambulatory Visit (INDEPENDENT_AMBULATORY_CARE_PROVIDER_SITE_OTHER): Payer: BC Managed Care – PPO | Admitting: *Deleted

## 2019-07-18 DIAGNOSIS — I6389 Other cerebral infarction: Secondary | ICD-10-CM

## 2019-07-22 LAB — CUP PACEART REMOTE DEVICE CHECK
Date Time Interrogation Session: 20210311001609
Implantable Pulse Generator Implant Date: 20180620

## 2019-07-22 NOTE — Progress Notes (Signed)
ILR Remote 

## 2019-08-19 ENCOUNTER — Ambulatory Visit (INDEPENDENT_AMBULATORY_CARE_PROVIDER_SITE_OTHER): Payer: BC Managed Care – PPO | Admitting: *Deleted

## 2019-08-19 DIAGNOSIS — I6389 Other cerebral infarction: Secondary | ICD-10-CM

## 2019-08-19 LAB — CUP PACEART REMOTE DEVICE CHECK
Date Time Interrogation Session: 20210411031300
Implantable Pulse Generator Implant Date: 20180620

## 2019-08-20 NOTE — Progress Notes (Signed)
ILR Remote 

## 2019-09-09 ENCOUNTER — Telehealth: Payer: Self-pay

## 2019-09-09 NOTE — Telephone Encounter (Signed)
Patients device has reached RRT on 09/08/19. Reached out to patient regarding leaving implanted or explanted. No answer, LMOVM.

## 2019-09-10 NOTE — Telephone Encounter (Signed)
I let the pt know per Marliss Czar, rn phone note he has reach RRT. I told him he can unplug the monitor and I will send him a return kit. I also told him he can choose to have his device explanted or leave it in. If he choose to leave it in it will not harm him. If he choose to have it removed he will need an appointment with Dr. Lovena Le to discuss it and have it removed. I cancelled his upcoming appointment. I took him out of [facility] and marked him inactive in paceart. I also sent him a return kit. He states he wanted to talk with his cardiologist to see if he should stop being monitored. He states he will call us back.

## 2019-09-18 ENCOUNTER — Telehealth: Payer: Self-pay

## 2019-09-18 NOTE — Telephone Encounter (Signed)
Left detailed message per DPR.  Advised per Dr. Ladona Ridgel- he does not need to be monitored any longer.  No afib found with 3 years worth of monitoring.  Advised to call scheduler if he would like his loop removed.  Advised he could safely leave loop in.  If he wanted to leave it in no action needed.  Gave scheduler information for loop removal.

## 2019-09-18 NOTE — Telephone Encounter (Signed)
-----   Message from Lenor Coffin, RN sent at 09/16/2019  1:34 PM EDT ----- Patient would like to speak with Dr. Ladona Ridgel regarding if he should stop being monitored.

## 2021-03-02 ENCOUNTER — Encounter: Payer: Self-pay | Admitting: Internal Medicine
